# Patient Record
Sex: Female | Born: 1975 | Race: White | Hispanic: No | Marital: Married | Smoking: Never smoker
Health system: Southern US, Community
[De-identification: ages and names within clinical notes are randomized; demographics above are authoritative.]

## PROBLEM LIST (undated history)

## (undated) DIAGNOSIS — E282 Polycystic ovarian syndrome: Secondary | ICD-10-CM

## (undated) DIAGNOSIS — E079 Disorder of thyroid, unspecified: Secondary | ICD-10-CM

## (undated) HISTORY — DX: Polycystic ovarian syndrome: E28.2

## (undated) HISTORY — PX: BACK SURGERY: SHX140

## (undated) HISTORY — PX: BREAST BIOPSY: SHX20

## (undated) HISTORY — PX: WISDOM TOOTH EXTRACTION: SHX21

---

## 2010-07-09 ENCOUNTER — Ambulatory Visit: Payer: Self-pay | Admitting: Family Medicine

## 2010-07-09 DIAGNOSIS — R51 Headache: Secondary | ICD-10-CM | POA: Insufficient documentation

## 2010-07-09 DIAGNOSIS — R519 Headache, unspecified: Secondary | ICD-10-CM | POA: Insufficient documentation

## 2010-07-09 DIAGNOSIS — N3000 Acute cystitis without hematuria: Secondary | ICD-10-CM | POA: Insufficient documentation

## 2010-07-09 DIAGNOSIS — E039 Hypothyroidism, unspecified: Secondary | ICD-10-CM

## 2010-07-09 DIAGNOSIS — E109 Type 1 diabetes mellitus without complications: Secondary | ICD-10-CM | POA: Insufficient documentation

## 2010-07-09 LAB — CONVERTED CEMR LAB
Bilirubin Urine: NEGATIVE
Glucose, Urine, Semiquant: NEGATIVE

## 2010-12-06 NOTE — Assessment & Plan Note (Signed)
Summary: Frequent, painful urination, cough-white, L ear pressurex3-4rm 5   Vital Signs:  Patient Profile:   35 Years Old Female CC:      Frequent, painful urination x 3-4 dys LMP:     06/27/2010 Height:     67 inches Weight:      208 pounds O2 Sat:      98 % O2 treatment:    Room Air Temp:     98.4 degrees F oral Pulse rate:   84 / minute Pulse rhythm:   regular Resp:     18 per minute BP sitting:   116 / 82  (left arm) Cuff size:   regular  Vitals Entered By: Areta Haber CMA (July 09, 2010 9:45 AM)  Menstrual History: LMP (date): 06/27/2010 LMP - Character: normal LMP - Reliable: Yes                  Current Allergies: No known allergies History of Present Illness Chief Complaint: Frequent, painful urination x 3-4 dys History of Present Illness:  Subjective:  Patient presents complaining of UTI symptoms for 2 days.  Complains of dysuria, frequency, nocturia, and urgency.  No hematuria.  No abnormal vaginal discharge.  No fever/chills/sweats.  No abdominal pain.  No flank pain.  No nausea/vomiting.  Last menstrual period normal.   Current Problems: ACUTE CYSTITIS (ICD-595.0) HYPOTHYROIDISM (ICD-244.9) HEADACHE (ICD-784.0) DIABETES MELLITUS, TYPE I (ICD-250.01)   Current Meds ARMOUR THYROID 120 MG TABS (THYROID) 1 tab by mouth once daily GLUMETZA 1000 MG XR24H-TAB (METFORMIN HCL) 1 tab by mouth once daily PHENTERMINE HCL 15 MG CAPS (PHENTERMINE HCL) 1 tab by mouth once daily ALEVE COLD & SINUS 120-220 MG XR12H-TAB (PSEUDOEPHEDRINE-NAPROXEN NA) as directed CLARITIN 10 MG TABS (LORATADINE) as directed MACROBID 100 MG CAPS (NITROFURANTOIN MONOHYD MACRO) 1 by mouth q12hr with food PYRIDIUM 200 MG TABS (PHENAZOPYRIDINE HCL) 1 by mouth three times a day pc  REVIEW OF SYSTEMS Constitutional Symptoms      Denies fever, chills, night sweats, weight loss, weight gain, and fatigue.  Eyes       Denies change in vision, eye pain, eye discharge, glasses, contact  lenses, and eye surgery. Ear/Nose/Throat/Mouth       Complains of ear pain, frequent runny nose, and sinus problems.      Denies hearing loss/aids, change in hearing, ear discharge, dizziness, frequent nose bleeds, sore throat, hoarseness, and tooth pain or bleeding.      Comments: L-pressurex3-4 dys  Respiratory       Complains of productive cough.      Denies dry cough, wheezing, shortness of breath, asthma, bronchitis, and emphysema/COPD.  Cardiovascular       Denies murmurs, chest pain, and tires easily with exhertion.    Gastrointestinal       Denies stomach pain, nausea/vomiting, diarrhea, constipation, blood in bowel movements, and indigestion. Genitourniary       Complains of painful urination.      Denies kidney stones and loss of urinary control.      Comments: Frequent x 3-4 dys Neurological       Complains of headaches.      Denies paralysis, seizures, and fainting/blackouts. Musculoskeletal       Denies muscle pain, joint pain, joint stiffness, decreased range of motion, redness, swelling, muscle weakness, and gout.      Comments: LBP Skin       Denies bruising, unusual mles/lumps or sores, and hair/skin or nail changes.  Psych  Denies mood changes, temper/anger issues, anxiety/stress, speech problems, depression, and sleep problems. Other Comments: Milky white. Pt has not seen her PCP for this.   Past History:  Past Medical History: Diabetes mellitus, type I Headache Hypothyroidism  Past Surgical History: Denies surgical history  Social History: Never Smoked Alcohol use-no Drug use-no Regular exercise-no Smoking Status:  never Drug Use:  no Does Patient Exercise:  no   Objective:   No acute distress, appears comfortable. Mouth:  moist mucous membranes Abdomen:  No tenderness to deep palpation. No rebound or guarding.  Non-distended.  Active bowel sounds.  No CVA or flank tenderness.  No masses or hepatosplenomegaly.    urinalysis (dipstick):  3+  leuks, 3+ blood Assessment New Problems: ACUTE CYSTITIS (ICD-595.0) HYPOTHYROIDISM (ICD-244.9) HEADACHE (ICD-784.0) DIABETES MELLITUS, TYPE I (ICD-250.01)   Plan New Medications/Changes: PYRIDIUM 200 MG TABS (PHENAZOPYRIDINE HCL) 1 by mouth three times a day pc  #6 x 0, 07/09/2010, Donna Christen MD MACROBID 100 MG CAPS (NITROFURANTOIN MONOHYD MACRO) 1 by mouth q12hr with food  #14 x 0, 07/09/2010, Donna Christen MD  New Orders: New Patient Level III [99203] Urinalysis [81003-65000] T-Culture, Urine [[id number]] Planning Comments:   Begin Macrobid and Pyridium.  Culture pending. Increase fluids. Follow-up with PCP if not improving.   The patient and/or caregiver has been counseled thoroughly with regard to medications prescribed including dosage, schedule, interactions, rationale for use, and possible side effects and they verbalize understanding.  Diagnoses and expected course of recovery discussed and will return if not improved as expected or if the condition worsens. Patient and/or caregiver verbalized understanding.  Prescriptions: PYRIDIUM 200 MG TABS (PHENAZOPYRIDINE HCL) 1 by mouth three times a day pc  #6 x 0   Entered and Authorized by:   Donna Christen MD   Signed by:   Donna Christen MD on 07/09/2010   Method used:   Print then Give to Patient   RxID:   [id number] MACROBID 100 MG CAPS (NITROFURANTOIN MONOHYD MACRO) 1 by mouth q12hr with food  #14 x 0   Entered and Authorized by:   Donna Christen MD   Signed by:   Donna Christen MD on 07/09/2010   Method used:   Print then Give to Patient   RxID:   [id number]   Orders Added: 1)  New Patient Level III [41324] 2)  Urinalysis [81003-65000] 3)  T-Culture, Urine [[id number]]  Laboratory Results   Urine Tests  Date/Time Received: July 09, 2010 10:02 AM  Date/Time Reported: July 09, 2010 10:02 AM   Routine Urinalysis   Color: lt. yellow Appearance: Hazy Glucose: negative   (Normal Range:  Negative) Bilirubin: negative   (Normal Range: Negative)    Comments: Husband had vasectomy     Appended Document: Frequent, painful urination, cough-white, L ear pressurex3-4rm 5 Urine Results: GLU Neg BIL Neg KET Neg SG 1.010 BLO Moderate pH 6.0 PRO Trace URO 0.2 NIT Neg LEU large  Appended Document: Frequent, painful urination, cough-white, L ear pressurex3-4rm 5 F/U cll to pt - Courtesy call mess left on VM.

## 2011-01-26 ENCOUNTER — Inpatient Hospital Stay (HOSPITAL_COMMUNITY): Admission: RE | Admit: 2011-01-26 | Payer: BC Managed Care – PPO | Source: Ambulatory Visit

## 2012-05-06 ENCOUNTER — Emergency Department (INDEPENDENT_AMBULATORY_CARE_PROVIDER_SITE_OTHER)
Admission: EM | Admit: 2012-05-06 | Discharge: 2012-05-06 | Disposition: A | Discharge status: Home / Self Care | Payer: BC Managed Care – PPO | Source: Home / Self Care

## 2012-05-06 ENCOUNTER — Encounter: Payer: Self-pay | Admitting: Emergency Medicine

## 2012-05-06 DIAGNOSIS — S60569A Insect bite (nonvenomous) of unspecified hand, initial encounter: Secondary | ICD-10-CM

## 2012-05-06 DIAGNOSIS — W57XXXA Bitten or stung by nonvenomous insect and other nonvenomous arthropods, initial encounter: Secondary | ICD-10-CM

## 2012-05-06 HISTORY — DX: Disorder of thyroid, unspecified: E07.9

## 2012-05-06 NOTE — ED Notes (Signed)
Insect bite on Rt 4th finger x 9 days ago, painful red

## 2012-05-06 NOTE — ED Provider Notes (Signed)
History     CSN: [id number]  Arrival date & time 05/06/12  1749   First MD Initiated Contact with Patient 05/06/12 1759      Chief Complaint  Patient presents with  . Insect Bite    HPI Comments: Pt reports bug bite while in [place] last week.  Pt reports R 4th finger blistering after incident followed by mild swelling that lasted 1-2 days.  Swelling and blistering then resolved.  Pt unsure of what type of insect caused bite.  No fevers or chills since this point.  Has had some intermittent R hand and forearm pain  No joint pain, rashes, headache or generalized malaise. Area has been progressively healing over last week.     The history is provided by the patient.    Past Medical History  Diagnosis Date  . Thyroid disease   . Diabetes mellitus     History reviewed. No pertinent past surgical history.  History reviewed. No pertinent family history.  History  Substance Use Topics  . Smoking status: Never Smoker   . Smokeless tobacco: Not on file  . Alcohol Use: No    OB History    Grav Para Term Preterm Abortions TAB SAB Ect Mult Living                  Review of Systems  All other systems reviewed and are negative.    Allergies  Review of patient's allergies indicates no known allergies.  Home Medications   Current Outpatient Rx  Name Route Sig Dispense Refill  . PHENTERMINE HCL 37.5 MG PO CAPS Oral Take 37.5 mg by mouth every morning.    . THYROID 120 MG PO TABS Oral Take 120 mg by mouth daily.      BP 123/80  Pulse 80  Temp 98.3 F (36.8 C) (Oral)  Resp 16  Ht 5\' 6"  (1.676 m)  Wt 194 lb (87.998 kg)  BMI 31.31 kg/m2  LMP 04/22/2012  Physical Exam  Constitutional: She appears well-developed and well-nourished.  HENT:  Head: Normocephalic and atraumatic.  Eyes: EOM are normal. Pupils are equal, round, and reactive to light.  Neck: Normal range of motion. Neck supple.  Cardiovascular: Normal rate and regular rhythm.   Pulmonary/Chest:  Effort normal and breath sounds normal.  Musculoskeletal:       Hands:   ED Course  Procedures (including critical care time)  Labs Reviewed - No data to display No results found.   No diagnosis found.    MDM  Insect bite.  Healing well. No signs of infection or necrosis.  Will continue to follow.  Discussed general and systemic red flags for reevaluation.  Pt agreeable to plan.     The patient and/or caregiver has been counseled thoroughly with regard to treatment plan and/or medications prescribed including dosage, schedule, interactions, rationale for use, and possible side effects and they verbalize understanding. Diagnoses and expected course of recovery discussed and will return if not improved as expected or if the condition worsens. Patient and/or caregiver verbalized understanding.             Floydene Flock, MD 05/06/12 [phone]

## 2012-05-08 NOTE — ED Provider Notes (Signed)
Pt seen by Dr. Doroteo Bradford, MD 05/08/12 [phone]

## 2012-05-10 ENCOUNTER — Telehealth: Payer: Self-pay

## 2012-05-10 NOTE — ED Notes (Signed)
Left a message on voice mail asking how patient is feeling and advising to call back with any questions or concerns.  

## 2013-01-06 ENCOUNTER — Emergency Department (INDEPENDENT_AMBULATORY_CARE_PROVIDER_SITE_OTHER)
Admission: EM | Admit: 2013-01-06 | Discharge: 2013-01-06 | Disposition: A | Discharge status: Home / Self Care | Payer: BC Managed Care – PPO | Source: Home / Self Care | Attending: Family Medicine | Admitting: Family Medicine

## 2013-01-06 ENCOUNTER — Emergency Department (INDEPENDENT_AMBULATORY_CARE_PROVIDER_SITE_OTHER): Payer: BC Managed Care – PPO

## 2013-01-06 ENCOUNTER — Encounter: Payer: Self-pay | Admitting: *Deleted

## 2013-01-06 DIAGNOSIS — R109 Unspecified abdominal pain: Secondary | ICD-10-CM

## 2013-01-06 DIAGNOSIS — N3000 Acute cystitis without hematuria: Secondary | ICD-10-CM

## 2013-01-06 DIAGNOSIS — D259 Leiomyoma of uterus, unspecified: Secondary | ICD-10-CM

## 2013-01-06 DIAGNOSIS — N831 Corpus luteum cyst of ovary, unspecified side: Secondary | ICD-10-CM

## 2013-01-06 DIAGNOSIS — R10A0 Flank pain, unspecified side: Secondary | ICD-10-CM

## 2013-01-06 LAB — POCT URINALYSIS DIP (MANUAL ENTRY)
Glucose, UA: NEGATIVE
Ketones, POC UA: NEGATIVE
Protein Ur, POC: NEGATIVE
Spec Grav, UA: 1.01 (ref 1.005–1.03)
Urobilinogen, UA: 0.2 (ref 0–1)

## 2013-01-06 LAB — POCT CBC W AUTO DIFF (K'VILLE URGENT CARE)

## 2013-01-06 LAB — SEDIMENTATION RATE: Sed Rate: 4 mm/hr (ref 0–22)

## 2013-01-06 LAB — RETICULOCYTES: RBC.: 4.38 MIL/uL (ref 3.87–5.11)

## 2013-01-06 MED ORDER — IOHEXOL 300 MG/ML  SOLN
100.0000 mL | Freq: Once | INTRAMUSCULAR | Status: AC | PRN
  Administered 2013-01-06: 100 mL via INTRAVENOUS

## 2013-01-06 MED ORDER — CEPHALEXIN 500 MG PO CAPS: 500.0000 mg | ORAL_CAPSULE | Freq: Three times a day (TID) | ORAL | Status: AC

## 2013-01-06 NOTE — ED Provider Notes (Addendum)
History     CSN: [id number]  Arrival date & time 01/06/13  0840   First MD Initiated Contact with Patient 01/06/13 [phone]      Chief Complaint  Patient presents with  . Back Pain  . Urinary Frequency   HPI This is a 37 year old female with prior history of hypothyroidism presenting today with multiple complaints over the past 4-5 days. Patient's issues vacuuming for 5 days ago and noticed right-sided lower back pain. Patient states his symptoms progress to involve bilateral back as well as abdomen over the course 24 hours. Patient states she also developed increased urinary frequency, though no dysuria or hematuria. No vaginal discharge or hx/o STDs. Patient has a prior history of urinary tract infections. Patient states symptoms are not similar to prior UTIs apart from increased urinary frequency. No hx/o kidney stones.  Patient also reports some intermittent distal paresthesias involving the hands and legs. Pain seems to be more of an ache that is 8 out of 10 at its worse. Pain can be worse with movement though has had flares while at rest. Has had some intermittent night sweats that involve lower extremities. Is followed by local endocrinologist about thyroid. Last TSH was 09/2012. This was normal per pt. Pt also reports some easy bruising over this time frame. No gum bleeding. + Family hx/o thyroid disease. Otherwise no other known medical issues. Pt does not have PCP.     Past Medical History  Diagnosis Date  . Thyroid disease   . Diabetes mellitus     Past Surgical History  Procedure Laterality Date  . Wisdom tooth extraction      Family History  Problem Relation Age of Onset  . Cancer Other   . Asthma Other     breast CA    History  Substance Use Topics  . Smoking status: Never Smoker   . Smokeless tobacco: Never Used  . Alcohol Use: Yes     Comment: social    OB History   Grav Para Term Preterm Abortions TAB SAB Ect Mult Living                  Review of Systems   All other systems reviewed and are negative.    Allergies  Review of patient's allergies indicates no known allergies.  Home Medications   Current Outpatient Rx  Name  Route  Sig  Dispense  Refill  . thyroid (ARMOUR) 120 MG tablet   Oral   Take 120 mg by mouth daily.           BP 125/84  Pulse 72  Temp(Src) 98.2 F (36.8 C) (Oral)  Ht 5' 6.5" (1.689 m)  Wt 202 lb (91.627 kg)  BMI 32.12 kg/m2  SpO2 98%  LMP 12/20/2012  Physical Exam  Constitutional: She appears well-developed and well-nourished.  HENT:  Head: Normocephalic and atraumatic.  Right Ear: External ear normal.  Left Ear: External ear normal.  Eyes: Conjunctivae are normal. Pupils are equal, round, and reactive to light.  Neck: Normal range of motion. Neck supple.  Cardiovascular: Normal rate and regular rhythm.   Pulmonary/Chest: Effort normal and breath sounds normal.  Abdominal:  + bowel sounds.  + TTP in RLQ, periumbilical area and LLQ (8/10) + R sided flank pain   Musculoskeletal: Normal range of motion.  Neurological: She is alert.  Skin: Skin is warm. No rash noted. No erythema.  Psychiatric: She has a normal mood and affect.    ED Course  Procedures (including critical care time)  Labs Reviewed  COMPREHENSIVE METABOLIC PANEL  SEDIMENTATION RATE  CK ISOENZYMES  TSH  VITAMIN B12  FOLATE  IRON AND TIBC  FERRITIN  RETICULOCYTES  POCT CBC W AUTO DIFF ([facility])  POCT URINALYSIS DIP (MANUAL ENTRY)   No results found.   1. Acute cystitis   2. Flank pain   3. Abdominal pain       MDM  Overall symptoms are extremely broad. Given abdominal pain we'll check a CT of abdomen and pelvis with IV and oral contrast to rule out intra-abdominal pathology like kidney stone, upper urinary tract infection, ovarian cyst. Will also check baseline labs including sedimentation rate, CK, TSH, anemia panel to eval for diffuse symptoms such as paresthesias. Given the patient does have  positive leukocytes on her urinalysis in the setting of flank pain and polyuria, will place patient on course of Keflex for UTI coverage. Urine culture. Discussed with patient she will need PCP followup for chronic issues. Checking TSH today in setting of Synthroid use. Follow up with primary care in 5-7 days for reevaluation of sxs.     The patient and/or caregiver has been counseled thoroughly with regard to treatment plan and/or medications prescribed including dosage, schedule, interactions, rationale for use, and possible side effects and they verbalize understanding. Diagnoses and expected course of recovery discussed and will return if not improved as expected or if the condition worsens. Patient and/or caregiver verbalized understanding.             Doree Albee, MD 01/06/13 1223  Doree Albee, MD 01/06/13 1227  Clinical update:  Ct Abdomen Pelvis W Contrast  01/06/2013  *RADIOLOGY REPORT*  Clinical Data: Left lower quadrant pain  CT ABDOMEN AND PELVIS WITH CONTRAST  Technique:  Multidetector CT imaging of the abdomen and pelvis was performed following the standard protocol during bolus administration of intravenous contrast.  Contrast: OMNIPAQUE IOHEXOL 300 MG/ML  SOLN  Comparison: None.  Findings: Lung bases are clear.  The liver and gallbladder and bile ducts are normal.  Pancreas spleen and kidneys are normal.  No renal calculi or obstruction.  Negative for bowel obstruction or bowel thickening.  Appendix is normal.  Tiny free fluid in the pelvis.  Corpus luteum cyst right ovary.  25 mm hyperdensity left fundus of the uterus compatible with a fibroid.  Urinary bladder is normal.  IMPRESSION: Corpus luteum cyst on the right with a minimal amount of free fluid.  25 mm left uterine fibroid.   Original Report Authenticated By: Janeece Riggers, M.D.    Noted corpus luteum cyst on L and uterine fibroid on R.  Discussed that if pain worsens/persist despite treatment, follow up  with GYN.  Pt expressed understanding.    Doree Albee, MD 01/06/13 1302

## 2013-01-06 NOTE — ED Notes (Signed)
[name] c/o 4 days of low back pain, pelvic pressure and urinary frequency x 4 days. 2 days of bruising without injury, sweats from the waist down and nausea.

## 2013-01-07 LAB — IRON AND TIBC
%SAT: 22 % (ref 20–55)
TIBC: 437 ug/dL (ref 250–470)
UIBC: 339 ug/dL (ref 125–400)

## 2013-01-07 LAB — COMPREHENSIVE METABOLIC PANEL
ALT: 17 U/L (ref 0–35)
AST: 16 U/L (ref 0–37)
Albumin: 4.5 g/dL (ref 3.5–5.2)
BUN: 12 mg/dL (ref 6–23)
CO2: 26 mEq/L (ref 19–32)
Calcium: 9.7 mg/dL (ref 8.4–10.5)
Chloride: 104 mEq/L (ref 96–112)
Creat: 0.61 mg/dL (ref 0.50–1.10)
Potassium: 4.2 mEq/L (ref 3.5–5.3)

## 2013-01-07 LAB — FOLATE: Folate: 7.4 ng/mL

## 2013-01-07 LAB — VITAMIN B12: Vitamin B-12: 303 pg/mL (ref 211–911)

## 2013-01-08 LAB — CK ISOENZYMES

## 2013-01-11 LAB — URINE CULTURE

## 2013-01-12 ENCOUNTER — Telehealth: Payer: Self-pay | Admitting: *Deleted

## 2013-01-13 ENCOUNTER — Telehealth: Payer: Self-pay | Admitting: Emergency Medicine

## 2014-01-07 ENCOUNTER — Emergency Department (INDEPENDENT_AMBULATORY_CARE_PROVIDER_SITE_OTHER): Payer: BC Managed Care – PPO

## 2014-01-07 ENCOUNTER — Emergency Department (INDEPENDENT_AMBULATORY_CARE_PROVIDER_SITE_OTHER)
Admission: EM | Admit: 2014-01-07 | Discharge: 2014-01-07 | Disposition: A | Discharge status: Home / Self Care | Payer: BC Managed Care – PPO | Source: Home / Self Care | Attending: Family Medicine | Admitting: Family Medicine

## 2014-01-07 ENCOUNTER — Encounter: Payer: Self-pay | Admitting: Emergency Medicine

## 2014-01-07 DIAGNOSIS — R059 Cough, unspecified: Secondary | ICD-10-CM

## 2014-01-07 DIAGNOSIS — R053 Chronic cough: Secondary | ICD-10-CM

## 2014-01-07 DIAGNOSIS — R05 Cough: Secondary | ICD-10-CM

## 2014-01-07 LAB — POCT CBC W AUTO DIFF (K'VILLE URGENT CARE)

## 2014-01-07 MED ORDER — BENZONATATE 200 MG PO CAPS: 200.0000 mg | ORAL_CAPSULE | Freq: Every day | ORAL | Status: DC

## 2014-01-07 MED ORDER — PREDNISONE 20 MG PO TABS: 20.0000 mg | ORAL_TABLET | Freq: Two times a day (BID) | ORAL | Status: DC

## 2014-01-07 MED ORDER — CLARITHROMYCIN 250 MG PO TABS: ORAL_TABLET | ORAL | Status: DC

## 2014-01-07 NOTE — ED Notes (Signed)
Beverly [name] c/o non-productive cough, congestion since mid January. The severity of the symptoms have been intermittent. Taken gen allergy med otc.

## 2014-01-07 NOTE — Discharge Instructions (Signed)
Take plain Mucinex (1200 mg guaifenesin) twice daily for cough and congestion.  May add Sudafed for sinus congestion.   Increase fluid intake, rest. °May use Afrin nasal spray (or generic oxymetazoline) twice daily for about 5 days.  Also recommend using saline nasal spray several times daily and saline nasal irrigation (AYR is a common brand) °Stop all antihistamines for now, and other non-prescription cough/cold preparations. °Follow-up with family doctor if not improving 7 to 10 days.  °

## 2014-01-07 NOTE — ED Provider Notes (Signed)
CSN: [id number]     Arrival date & time 01/07/14  0806 History   None    Chief Complaint  Patient presents with  . URI      HPI Comments: Patient reports that she developed a cold about 6 weeks ago and seemed to recover except for a persistent cough.  Over the last week she has developed chills/sweats, and wheezing with increased activity.  She notes that she has a history of allergic rhinitis, especially around dogs.  The history is provided by the patient.    Past Medical History  Diagnosis Date  . Thyroid disease   . Diabetes mellitus    Past Surgical History  Procedure Laterality Date  . Wisdom tooth extraction     Family History  Problem Relation Age of Onset  . Cancer Other   . Asthma Other     breast CA   History  Substance Use Topics  . Smoking status: Never Smoker   . Smokeless tobacco: Never Used  . Alcohol Use: Yes     Comment: social   OB History   Grav Para Term Preterm Abortions TAB SAB Ect Mult Living                 Review of Systems No sore throat + cough No pleuritic pain No wheezing + nasal congestion + post-nasal drainage No sinus pain/pressure No itchy/red eyes ? earache No hemoptysis + SOB No fever, + chills/sweats No nausea No vomiting No abdominal pain No diarrhea No urinary symptoms No skin rash No fatigue No myalgias No headache Used OTC meds without relief  Allergies  Review of patient's allergies indicates no known allergies.  Home Medications   Current Outpatient Rx  Name  Route  Sig  Dispense  Refill  . Multiple Vitamin (MULTIVITAMIN) tablet   Oral   Take 1 tablet by mouth daily.         . benzonatate (TESSALON) 200 MG capsule   Oral   Take 1 capsule (200 mg total) by mouth at bedtime. Take as needed for cough   12 capsule   0   . clarithromycin (BIAXIN) 250 MG tablet      Take one tab by mouth twice daily.  Take with food   20 tablet   0   . predniSONE (DELTASONE) 20 MG tablet   Oral   Take 1 tablet  (20 mg total) by mouth 2 (two) times daily. Take with food.   10 tablet   0   . thyroid (ARMOUR) 120 MG tablet   Oral   Take 120 mg by mouth daily.          BP 130/86  Pulse 76  Temp(Src) 97.9 F (36.6 C) (Oral)  Resp 14  Wt 215 lb (97.523 kg)  SpO2 97%  LMP 12/30/2013 Physical Exam Nursing notes and Vital Signs reviewed. Appearance:  Patient appears stated age, and in no acute distress.  Patient is obese Eyes:  Pupils are equal, round, and reactive to light and accomodation.  Extraocular movement is intact.  Conjunctivae are not inflamed  Ears:  Canals normal.  Tympanic membranes normal.  Nose:  Mildly congested turbinates.  No sinus tenderness.   Pharynx:  Normal Neck:  Supple.  Tender shotty posterior nodes are palpated bilaterally  Lungs:  Clear to auscultation.  Breath sounds are equal.  Heart:  Regular rate and rhythm without murmurs, rubs, or gallops.  Abdomen:  Nontender without masses or hepatosplenomegaly.  Bowel sounds are  present.  No CVA or flank tenderness.  Extremities:  No edema.  No calf tenderness Skin:  No rash present.   ED Course  Procedures  none    Labs Reviewed  POCT CBC W AUTO DIFF ([facility]):  WBC 6.4; LY 30.2; MO 6.2; GR 63.6; Hgb 13.5; Platelets 231    Imaging Review Dg Chest 2 View  01/07/2014   CLINICAL DATA:  Persistent cough for 2-1/2 months  EXAM: CHEST  2 VIEW  COMPARISON:  None.  FINDINGS: No active infiltrate or effusion is seen. Mediastinal contours appear normal. The heart is within normal limits in size. No acute bony abnormality is seen. Some deformity of the left clavicle most likely is due to prior fracture with healing.  IMPRESSION: No active lung disease.   Electronically Signed   By: Ivar Drape M.D.   On: 01/07/2014 08:54     MDM   1. Chronic cough    Begin Biaxin to cover atypicals.  Prednisone burst.  Prescription written for Benzonatate (Tessalon) to take at bedtime for night-time cough.  Take plain Mucinex  (1200 mg guaifenesin) twice daily for cough and congestion.  May add Sudafed for sinus congestion.   Increase fluid intake, rest. May use Afrin nasal spray (or generic oxymetazoline) twice daily for about 5 days.  Also recommend using saline nasal spray several times daily and saline nasal irrigation (AYR is a common brand) Stop all antihistamines for now, and other non-prescription cough/cold preparations. Follow-up with family doctor if not improving 7 to 10 days.     Kandra Nicolas, MD 01/08/14 [phone]

## 2014-06-01 ENCOUNTER — Encounter: Payer: Self-pay | Admitting: Emergency Medicine

## 2014-06-01 ENCOUNTER — Emergency Department (INDEPENDENT_AMBULATORY_CARE_PROVIDER_SITE_OTHER)
Admission: EM | Admit: 2014-06-01 | Discharge: 2014-06-01 | Disposition: A | Discharge status: Home / Self Care | Payer: BC Managed Care – PPO | Source: Home / Self Care | Attending: Emergency Medicine | Admitting: Emergency Medicine

## 2014-06-01 DIAGNOSIS — R300 Dysuria: Secondary | ICD-10-CM

## 2014-06-01 DIAGNOSIS — S2341XA Sprain of ribs, initial encounter: Secondary | ICD-10-CM

## 2014-06-01 DIAGNOSIS — R309 Painful micturition, unspecified: Secondary | ICD-10-CM

## 2014-06-01 DIAGNOSIS — S29011A Strain of muscle and tendon of front wall of thorax, initial encounter: Secondary | ICD-10-CM

## 2014-06-01 LAB — POCT URINALYSIS DIP (MANUAL ENTRY)
Bilirubin, UA: NEGATIVE
Blood, UA: NEGATIVE
Glucose, UA: NEGATIVE
Ketones, POC UA: NEGATIVE
Leukocytes, UA: NEGATIVE
Nitrite, UA: NEGATIVE
Protein Ur, POC: NEGATIVE
Spec Grav, UA: 1.025 (ref 1.005–1.03)
Urobilinogen, UA: 0.2 (ref 0–1)
pH, UA: 7 (ref 5–8)

## 2014-06-01 MED ORDER — TRAMADOL-ACETAMINOPHEN 37.5-325 MG PO TABS: ORAL_TABLET | ORAL | Status: DC

## 2014-06-01 NOTE — ED Provider Notes (Signed)
CSN: [id number]     Arrival date & time 06/01/14  1246 History   First MD Initiated Contact with Patient 06/01/14 1431     Chief Complaint  Patient presents with  . Urinary Frequency  . Back Pain  . Pelvic Pain    Patient is a 38 y.o. female presenting with back pain. The history is provided by the patient.  Back Pain Pain location: Right thoracic, posterior lateral. Quality: sharp and aching. Radiates to: Towards the right anterior ribs/costal margin. Pain severity:  Moderate Pain is:  Unable to specify Onset quality:  Unable to specify Timing:  Intermittent Progression:  Waxing and waning Chronicity:  New Relieved by: Rest. Exacerbated by: Twisting or pressing on the area. Ineffective treatments: Motrin. Associated symptoms: no abdominal pain, no chest pain, no dysuria, no fever, no leg pain, no numbness and no pelvic pain    About 3 weeks ago, she was helping her husband built a gondola, and she was stretched out holding a heavy object in various positions. Since then, right intercostal muscle pain, exacerbated by movement. She denies any history of direct trauma. No anterior chest pain or any breathing problems. No abdominal pain, nausea, vomiting. She mentions that she may have had a twinge of pelvic pain but that resolved . Last menstrual period started 05/21/2014 and ended after 5 days. Has minimal clear vaginal discharge for one day, which she states is normal for her at this time in her menstrual cycle . Denies vaginal symptoms. No vaginal bleeding currently Last week she felt some vague dysuria and occasional urinary frequency but that's resolved. She saw her PCP 3 days ago, evaluated with normal urinalysis and was told today that her urine culture there was negative . Denies any hematuria or pyuria. She felt she might have a fever 2 days ago but that's resolved. May have had some nausea but that's resolved. No ENT symptoms   She states that in the past had a CT of  abdomen for some vague abdominal pain, and I reviewed in her chart that on 01/06/2013, CT abdomen and pelvis with contrast was negative except for a 25 mm corpus luteum cyst on the right. She has not seeing the gynecologist for any routine care in several years, but she's just made an appointment with the GYN for 8/19.   Past Medical History  Diagnosis Date  . Thyroid disease   . Diabetes mellitus    Past Surgical History  Procedure Laterality Date  . Wisdom tooth extraction     Family History  Problem Relation Age of Onset  . Cancer Other   . Asthma Other     breast CA   History  Substance Use Topics  . Smoking status: Never Smoker   . Smokeless tobacco: Never Used  . Alcohol Use: Yes     Comment: social   OB History   Grav Para Term Preterm Abortions TAB SAB Ect Mult Living                 Review of Systems  Constitutional: Negative for fever.  Cardiovascular: Negative for chest pain.  Gastrointestinal: Negative for abdominal pain.  Genitourinary: Negative for dysuria and pelvic pain.  Musculoskeletal: Positive for back pain.  Neurological: Negative for numbness.  All other systems reviewed and are negative.   Allergies  Review of patient's allergies indicates no known allergies.  Home Medications   Prior to Admission medications   Medication Sig Start Date End Date Taking? Authorizing Provider  Multiple Vitamin (MULTIVITAMIN) tablet Take 1 tablet by mouth daily.   Yes Historical Provider, MD  thyroid (ARMOUR) 120 MG tablet Take 120 mg by mouth daily.   Yes Historical Provider, MD  traMADol-acetaminophen (ULTRACET) 37.5-325 MG per tablet 1 or 2 every 4-6 hours as needed for moderate-severe pain.  Caution: May cause drowsiness 06/01/14   Jacqulyn Cane, MD   BP 139/86  Pulse 88  Temp(Src) 98.3 F (36.8 C) (Oral)  Ht 5\' 6"  (1.676 m)  SpO2 98%  LMP 05/11/2014 Physical Exam  Nursing note and vitals reviewed. Constitutional: She is oriented to person, place, and  time. She appears well-developed and well-nourished. No distress.  No distress.  HENT:  Head: Normocephalic and atraumatic.  Mouth/Throat: Oropharynx is clear and moist.  Eyes: Conjunctivae and EOM are normal. Pupils are equal, round, and reactive to light. Right eye exhibits no discharge. Left eye exhibits no discharge. No scleral icterus.  Neck: Normal range of motion. Neck supple. No JVD present.  Cardiovascular: Normal rate, regular rhythm and normal heart sounds.  Exam reveals no gallop and no friction rub.   No murmur heard. Pulmonary/Chest: Effort normal and breath sounds normal. No respiratory distress. She has no wheezes. She has no rales.  Abdominal: Soft. Bowel sounds are normal. She exhibits no distension and no mass. There is no hepatosplenomegaly. There is no tenderness. There is no rigidity, no rebound, no guarding, no tenderness at McBurney's point and negative Murphy's sign.  Musculoskeletal: Normal range of motion.       Arms: No deformity. Moderate tenderness to palpation right parathoracic muscles and lateral intercostal muscles. Exacerbated by twisting or going from lying to sitting. She states that that exactly reproduces the pain that she's been having. No specific rib tenderness . No deformity. No spinal tenderness or deformity.  Lymphadenopathy:    She has no cervical adenopathy.  Neurological: She is alert and oriented to person, place, and time. She has normal strength. No cranial nerve deficit or sensory deficit. Gait normal.  Skin: Skin is warm. No rash noted.  Psychiatric: She has a normal mood and affect.    ED Course  Procedures (including critical care time) Labs Review Labs Reviewed  POCT URINALYSIS DIP (MANUAL ENTRY) - Normal   Results for orders placed during the hospital encounter of 06/01/14  POCT URINALYSIS DIP (MANUAL ENTRY)      Result Value Ref Range   Color, UA yellow     Clarity, UA clear     Glucose, UA neg     Bilirubin, UA negative      Bilirubin, UA negative     Spec Grav, UA 1.025  1.005 - 1.03   Blood, UA negative     pH, UA 7.0  5 - 8   Protein Ur, POC negative     Urobilinogen, UA 0.2  0 - 1   Nitrite, UA Negative     Leukocytes, UA Negative       Imaging Review No results found.   MDM  Urinalysis today is Normal. Dx: R parathoracic Muscle and R Intercostal mm strain. No evidence of abdominal cause. No evidence of any urinary cause. I doubt any pelvic cause for the symptoms.  Treatment options discussed, as well as risks, benefits, alternatives. Patient voiced understanding and agreement with the following plans: She declined any imaging. Ultracet prn pain Heat. Rest. No lifting x 1 wk. Follow-up with your primary care doctor in 5-7 days if not improving, or sooner if symptoms become  worse. Precautions discussed. Red flags discussed. Questions invited and answered. Patient voiced understanding and agreement.     Jacqulyn Cane, MD 06/01/14 [phone]

## 2014-06-01 NOTE — ED Notes (Signed)
Beverly [name] complains of back pain for 3-4 weeks. She also reports fevers, body aches, dizziness, pelvic pain, nausea, urinary frequency and painful urination for 2 days.

## 2014-06-24 ENCOUNTER — Encounter: Payer: Self-pay | Admitting: Obstetrics & Gynecology

## 2014-06-24 ENCOUNTER — Ambulatory Visit (INDEPENDENT_AMBULATORY_CARE_PROVIDER_SITE_OTHER): Payer: BC Managed Care – PPO | Admitting: Obstetrics & Gynecology

## 2014-06-24 VITALS — BP 119/78 | HR 72 | Resp 16 | Ht 66.0 in | Wt 213.0 lb

## 2014-06-24 DIAGNOSIS — N898 Other specified noninflammatory disorders of vagina: Secondary | ICD-10-CM

## 2014-06-24 DIAGNOSIS — Z0000 Encounter for general adult medical examination without abnormal findings: Secondary | ICD-10-CM

## 2014-06-24 DIAGNOSIS — E669 Obesity, unspecified: Secondary | ICD-10-CM | POA: Insufficient documentation

## 2014-06-24 DIAGNOSIS — Z1151 Encounter for screening for human papillomavirus (HPV): Secondary | ICD-10-CM

## 2014-06-24 DIAGNOSIS — Z124 Encounter for screening for malignant neoplasm of cervix: Secondary | ICD-10-CM

## 2014-06-24 DIAGNOSIS — Z01419 Encounter for gynecological examination (general) (routine) without abnormal findings: Secondary | ICD-10-CM

## 2014-06-24 MED ORDER — METRONIDAZOLE 500 MG PO TABS: 500.0000 mg | ORAL_TABLET | Freq: Two times a day (BID) | ORAL | Status: DC

## 2014-06-24 NOTE — Progress Notes (Signed)
Subjective:    Beverly [name] is a 38 y.o. MW G6P4A2 (12, 41, 68, and 40 yo kids)  female who presents for an annual exam. The patient has no complaints today. The patient is sexually active. GYN screening history: last pap: was normal. The patient wears seatbelts: yes. The patient participates in regular exercise: yes. Has the patient ever been transfused or tattooed?: yes. The patient reports that there is not domestic violence in her life.   Menstrual History: OB History   Grav Para Term Preterm Abortions TAB SAB Ect Mult Living   6 4 4  2 2    4       Menarche age: 86  Patient's last menstrual period was 06/11/2014.    The following portions of the patient's history were reviewed and updated as appropriate: allergies, current medications, past family history, past medical history, past social history, past surgical history and problem list.  Review of Systems A comprehensive review of systems was negative. Married for 15 years, some vaginal dryness. Homemaker   Objective:    BP 119/78  Pulse 72  Resp 16  Ht 5\' 6"  (1.676 m)  Wt 213 lb (96.616 kg)  BMI 34.40 kg/m2  LMP 06/11/2014  General Appearance:    Alert, cooperative, no distress, appears stated age  Head:    Normocephalic, without obvious abnormality, atraumatic  Eyes:    PERRL, conjunctiva/corneas clear, EOM's intact, fundi    benign, both eyes  Ears:    Normal TM's and external ear canals, both ears  Nose:   Nares normal, septum midline, mucosa normal, no drainage    or sinus tenderness  Throat:   Lips, mucosa, and tongue normal; teeth and gums normal  Neck:   Supple, symmetrical, trachea midline, no adenopathy;    thyroid:  no enlargement/tenderness/nodules; no carotid   bruit or JVD  Back:     Symmetric, no curvature, ROM normal, no CVA tenderness  Lungs:     Clear to auscultation bilaterally, respirations unlabored  Chest Wall:    No tenderness or deformity   Heart:    Regular rate and rhythm, S1 and S2 normal,  no murmur, rub   or gallop  Breast Exam:    No tenderness, masses, or nipple abnormality  Abdomen:     Soft, non-tender, bowel sounds active all four quadrants,    no masses, no organomegaly  Genitalia:    Normal female without lesion, discharge or tenderness, frothy vaginal discharge, NSSA, lumpy c/w fibroids, mobile, NT, normal adnexal exam     Extremities:   Extremities normal, atraumatic, no cyanosis or edema  Pulses:   2+ and symmetric all extremities  Skin:   Skin color, texture, turgor normal, no rashes or lesions  Lymph nodes:   Cervical, supraclavicular, and axillary nodes normal  Neurologic:   CNII-XII intact, normal strength, sensation and reflexes    throughout  .    Assessment:    Healthy female exam.  Frothy vaginal discharge   Plan:     Breast self exam technique reviewed and patient encouraged to perform self-exam monthly. Mammogram. Thin prep Pap smear.  with cotesting Wet prep sent

## 2014-06-25 ENCOUNTER — Telehealth: Payer: Self-pay | Admitting: *Deleted

## 2014-06-25 DIAGNOSIS — B373 Candidiasis of vulva and vagina: Secondary | ICD-10-CM

## 2014-06-25 DIAGNOSIS — B3731 Acute candidiasis of vulva and vagina: Secondary | ICD-10-CM

## 2014-06-25 LAB — WET PREP, GENITAL
Clue Cells Wet Prep HPF POC: NONE SEEN
Trich, Wet Prep: NONE SEEN
WBC, Wet Prep HPF POC: NONE SEEN

## 2014-06-25 LAB — CYTOLOGY - PAP

## 2014-06-25 MED ORDER — FLUCONAZOLE 150 MG PO TABS
ORAL_TABLET | ORAL | Status: DC
Start: 2014-06-25 — End: 2015-10-28

## 2014-06-25 NOTE — Telephone Encounter (Signed)
Pt notified of positive yeast and RX called to [facility].

## 2014-06-30 ENCOUNTER — Ambulatory Visit: Payer: BC Managed Care – PPO

## 2014-07-07 ENCOUNTER — Ambulatory Visit (INDEPENDENT_AMBULATORY_CARE_PROVIDER_SITE_OTHER): Payer: BC Managed Care – PPO

## 2014-07-07 DIAGNOSIS — R928 Other abnormal and inconclusive findings on diagnostic imaging of breast: Secondary | ICD-10-CM

## 2014-07-07 DIAGNOSIS — Z0000 Encounter for general adult medical examination without abnormal findings: Secondary | ICD-10-CM

## 2014-07-09 ENCOUNTER — Other Ambulatory Visit: Payer: Self-pay | Admitting: Obstetrics & Gynecology

## 2014-07-09 DIAGNOSIS — R928 Other abnormal and inconclusive findings on diagnostic imaging of breast: Secondary | ICD-10-CM

## 2014-07-20 ENCOUNTER — Other Ambulatory Visit: Payer: Self-pay | Admitting: Obstetrics & Gynecology

## 2014-07-20 ENCOUNTER — Ambulatory Visit
Admission: RE | Admit: 2014-07-20 | Discharge: 2014-07-20 | Disposition: A | Discharge status: Home / Self Care | Payer: BC Managed Care – PPO | Source: Ambulatory Visit | Attending: Obstetrics & Gynecology | Admitting: Obstetrics & Gynecology

## 2014-07-20 DIAGNOSIS — R928 Other abnormal and inconclusive findings on diagnostic imaging of breast: Secondary | ICD-10-CM

## 2014-07-23 ENCOUNTER — Other Ambulatory Visit: Payer: Self-pay

## 2014-07-23 ENCOUNTER — Other Ambulatory Visit: Payer: Self-pay | Admitting: Obstetrics & Gynecology

## 2014-07-23 DIAGNOSIS — R928 Other abnormal and inconclusive findings on diagnostic imaging of breast: Secondary | ICD-10-CM

## 2014-07-27 ENCOUNTER — Other Ambulatory Visit: Payer: Self-pay | Admitting: Obstetrics & Gynecology

## 2014-07-27 ENCOUNTER — Ambulatory Visit
Admission: RE | Admit: 2014-07-27 | Discharge: 2014-07-27 | Disposition: A | Discharge status: Home / Self Care | Payer: BC Managed Care – PPO | Source: Ambulatory Visit | Attending: Obstetrics & Gynecology | Admitting: Obstetrics & Gynecology

## 2014-07-27 DIAGNOSIS — R928 Other abnormal and inconclusive findings on diagnostic imaging of breast: Secondary | ICD-10-CM

## 2014-09-07 ENCOUNTER — Encounter: Payer: Self-pay | Admitting: Obstetrics & Gynecology

## 2015-10-28 ENCOUNTER — Emergency Department (INDEPENDENT_AMBULATORY_CARE_PROVIDER_SITE_OTHER)
Admission: EM | Admit: 2015-10-28 | Discharge: 2015-10-28 | Disposition: A | Discharge status: Home / Self Care | Payer: BLUE CROSS/BLUE SHIELD | Source: Home / Self Care | Attending: Family Medicine | Admitting: Family Medicine

## 2015-10-28 ENCOUNTER — Encounter: Payer: Self-pay | Admitting: *Deleted

## 2015-10-28 DIAGNOSIS — R42 Dizziness and giddiness: Secondary | ICD-10-CM | POA: Diagnosis not present

## 2015-10-28 DIAGNOSIS — R51 Headache: Secondary | ICD-10-CM | POA: Diagnosis not present

## 2015-10-28 DIAGNOSIS — R0981 Nasal congestion: Secondary | ICD-10-CM | POA: Diagnosis not present

## 2015-10-28 DIAGNOSIS — R519 Headache, unspecified: Secondary | ICD-10-CM

## 2015-10-28 MED ORDER — FLUTICASONE PROPIONATE 50 MCG/ACT NA SUSP: 2.0000 | Freq: Every day | NASAL | Status: DC

## 2015-10-28 MED ORDER — DM-GUAIFENESIN ER 30-600 MG PO TB12: 1.0000 | ORAL_TABLET | Freq: Two times a day (BID) | ORAL | Status: DC

## 2015-10-28 MED ORDER — MECLIZINE HCL 25 MG PO TABS: 25.0000 mg | ORAL_TABLET | Freq: Three times a day (TID) | ORAL | Status: DC | PRN

## 2015-10-28 NOTE — ED Provider Notes (Signed)
CSN: [id number]     Arrival date & time 10/28/15  [id number] History   First MD Initiated Contact with Patient 10/28/15 [phone]     Chief Complaint  Patient presents with  . Ear Drainage  . Headache  . Eye Drainage   (Consider location/radiation/quality/duration/timing/severity/associated sxs/prior Treatment) HPI  Pt is a 39yo female presenting to [facility] with c/o frontal headache with nasal congestion and "clogged ears."  Pt reports 1 week long hx of vertigo described as room spinning with mild nausea, hx of similar symptoms in the past. Headache is 5/10 at worst.  She has been going to a chiropractor but only getting minimal relief.  Pt denies any ear pain and states symptoms of congestion only started 2 days ago. Denies fever or chills. Denies cough. Denies sore throat or ear pain.  She has been trying motrin with temporary relief of headaches.   Past Medical History  Diagnosis Date  . Thyroid disease   . Diabetes mellitus   . PCOS (polycystic ovarian syndrome)    Past Surgical History  Procedure Laterality Date  . Wisdom tooth extraction     Family History  Problem Relation Age of Onset  . Cancer Other   . Asthma Other     breast CA   Social History  Substance Use Topics  . Smoking status: Never Smoker   . Smokeless tobacco: Never Used  . Alcohol Use: Yes     Comment: social   OB History    Gravida Para Term Preterm AB TAB SAB Ectopic Multiple Living   6 4 4  2 2    4      Review of Systems  Constitutional: Negative for fever and chills.  HENT: Positive for congestion, rhinorrhea and sinus pressure. Negative for ear pain, sore throat, trouble swallowing and voice change.   Respiratory: Negative for cough and shortness of breath.   Cardiovascular: Negative for chest pain and palpitations.  Gastrointestinal: Negative for nausea, vomiting, abdominal pain and diarrhea.  Musculoskeletal: Negative for myalgias, back pain and arthralgias.  Neurological: Positive for dizziness and  headaches. Negative for light-headedness.    Allergies  Review of patient's allergies indicates no known allergies.  Home Medications   Prior to Admission medications   Medication Sig Start Date End Date Taking? Authorizing Provider  NON FORMULARY Tumeric   Yes Historical Provider, MD  dextromethorphan-guaiFENesin (MUCINEX DM) 30-600 MG 12hr tablet Take 1 tablet by mouth 2 (two) times daily. 10/28/15   Noland Fordyce, PA-C  fluticasone (FLONASE) 50 MCG/ACT nasal spray Place 2 sprays into both nostrils daily. 10/28/15   Noland Fordyce, PA-C  meclizine (ANTIVERT) 25 MG tablet Take 1 tablet (25 mg total) by mouth 3 (three) times daily as needed for dizziness. 10/28/15   Noland Fordyce, PA-C   Meds Ordered and Administered this Visit  Medications - No data to display  BP 130/83 mmHg  Pulse 70  Temp(Src) 98.4 F (36.9 C) (Oral)  Resp 16  Wt 216 lb (97.977 kg)  SpO2 97%  LMP 09/16/2015 No data found.   Physical Exam  Constitutional: She appears well-developed and well-nourished. No distress.  HENT:  Head: Normocephalic and atraumatic.  Right Ear: Hearing, tympanic membrane, external ear and ear canal normal.  Left Ear: Hearing, external ear and ear canal normal.  Nose: Mucosal edema present. Right sinus exhibits no maxillary sinus tenderness and no frontal sinus tenderness. Left sinus exhibits no maxillary sinus tenderness and no frontal sinus tenderness.  Mouth/Throat: Uvula is midline, oropharynx is  clear and moist and mucous membranes are normal.  Eyes: Conjunctivae and EOM are normal. Pupils are equal, round, and reactive to light. No scleral icterus. Right eye exhibits no nystagmus. Left eye exhibits no nystagmus.  Neck: Normal range of motion. Neck supple.  Cardiovascular: Normal rate, regular rhythm and normal heart sounds.   Pulmonary/Chest: Effort normal and breath sounds normal. No respiratory distress. She has no wheezes. She has no rales. She exhibits no tenderness.   Abdominal: Soft. She exhibits no distension and no mass. There is no tenderness. There is no rebound and no guarding.  Musculoskeletal: Normal range of motion.  Neurological: She is alert.  Skin: Skin is warm and dry. She is not diaphoretic.  Nursing note and vitals reviewed.   ED Course  Procedures (including critical care time)  Labs Review Labs Reviewed - No data to display  Imaging Review No results found.  Tympanometry: normal   MDM   1. Nasal congestion   2. Frontal headache   3. Vertigo     Pt c/o 1 week long hx of vertigo but 2 day hx of nasal congestion and frontal headache. Normal neuro exam. No nystagmus.   Tympanometry: normal   No evidence of AOM.  With just 2 days of nasal congestion, will tx conservatively and hold off on antibiotics at this time.  Rx: mucinex DM, Flonase, antivert. Advised she may also use OTC sudafed and continue with ibuprofen  Encouraged fluids and rest. If not improving in 3-4 days, or symptoms continue to worsen, encouraged pt to call [facility] and antibiotic- Augmentin 875/125 BID for 7 days with 3 days of prednisone 40mg  could be called in.   Patient verbalized understanding and agreement with treatment plan.    Noland Fordyce, PA-C 10/28/15 [phone]

## 2015-10-28 NOTE — ED Notes (Signed)
Pt c/o 2 days of HA, eye swelling and drainage, ear drainage and sinus congestion and pain. Taken Motrin otc.

## 2015-10-28 NOTE — Discharge Instructions (Signed)
Please call [facility] if you are not getting better in 3-4 days, sooner if worsening including high fever >100.4, vomiting, severe headache or other new concerning symptoms develop, and an antibiotic will be called in for you.  You may take 400-600mg  Ibuprofen (Motrin) every 6-8 hours for fever and pain  Alternate with Tylenol  You may take 500mg  Tylenol every 4-6 hours as needed for fever and pain  Follow-up with your primary care provider next week for recheck of symptoms if not improving.  Be sure to drink plenty of fluids and rest, at least 8hrs of sleep a night, preferably more while you are sick. Return urgent care or go to closest ER if you cannot keep down fluids/signs of dehydration, fever not reducing with Tylenol, difficulty breathing/wheezing, stiff neck, worsening condition, or other concerns (see below)

## 2015-11-02 ENCOUNTER — Telehealth: Payer: Self-pay | Admitting: Emergency Medicine

## 2015-11-02 NOTE — ED Notes (Signed)
Inquired about patient's status; encourage them to call with questions/concerns.  

## 2016-01-19 ENCOUNTER — Other Ambulatory Visit (HOSPITAL_COMMUNITY): Payer: Self-pay | Admitting: Obstetrics & Gynecology

## 2016-01-19 DIAGNOSIS — Z1231 Encounter for screening mammogram for malignant neoplasm of breast: Secondary | ICD-10-CM

## 2016-01-26 ENCOUNTER — Ambulatory Visit (INDEPENDENT_AMBULATORY_CARE_PROVIDER_SITE_OTHER): Payer: BLUE CROSS/BLUE SHIELD

## 2016-01-26 DIAGNOSIS — Z1231 Encounter for screening mammogram for malignant neoplasm of breast: Secondary | ICD-10-CM | POA: Diagnosis not present

## 2016-02-01 ENCOUNTER — Encounter: Payer: Self-pay | Admitting: Obstetrics & Gynecology

## 2016-02-01 ENCOUNTER — Ambulatory Visit (INDEPENDENT_AMBULATORY_CARE_PROVIDER_SITE_OTHER): Payer: BLUE CROSS/BLUE SHIELD | Admitting: Obstetrics & Gynecology

## 2016-02-01 VITALS — BP 125/80 | HR 78 | Resp 16 | Ht 66.0 in | Wt 222.0 lb

## 2016-02-01 DIAGNOSIS — Z1151 Encounter for screening for human papillomavirus (HPV): Secondary | ICD-10-CM | POA: Diagnosis not present

## 2016-02-01 DIAGNOSIS — Z124 Encounter for screening for malignant neoplasm of cervix: Secondary | ICD-10-CM

## 2016-02-01 DIAGNOSIS — Z01419 Encounter for gynecological examination (general) (routine) without abnormal findings: Secondary | ICD-10-CM | POA: Diagnosis not present

## 2016-02-01 NOTE — Progress Notes (Signed)
Subjective:    Beverly [name] is a 40 y.o.  MW P4 (83, 64, 61, and 50 yo kids)  female who presents for an annual exam. The patient has no complaints today. The patient is sexually active. GYN screening history: last pap: was normal. The patient wears seatbelts: yes. The patient participates in regular exercise: yes. Has the patient ever been transfused or tattooed?: yes. The patient reports that there is not domestic violence in her life.   Menstrual History: OB History    Gravida Para Term Preterm AB TAB SAB Ectopic Multiple Living   6 4 4  2 2    4       Menarche age: 77  Patient's last menstrual period was 01/05/2016.    The following portions of the patient's history were reviewed and updated as appropriate: allergies, current medications, past family history, past medical history, past social history, past surgical history and problem list.  Review of Systems Pertinent items noted in HPI and remainder of comprehensive ROS otherwise negative.  Married 16 years, uses lubrication with sex. Her husband had a vasectomy. Homemaker. Declines flu vaccine. Mammogram normal last week.   Objective:    BP 125/80 mmHg  Pulse 78  Resp 16  Ht 5\' 6"  (1.676 m)  Wt 222 lb (100.699 kg)  BMI 35.85 kg/m2  LMP 01/05/2016  General Appearance:    Alert, cooperative, no distress, appears stated age  Head:    Normocephalic, without obvious abnormality, atraumatic  Eyes:    PERRL, conjunctiva/corneas clear, EOM's intact, fundi    benign, both eyes  Ears:    Normal TM's and external ear canals, both ears  Nose:   Nares normal, septum midline, mucosa normal, no drainage    or sinus tenderness  Throat:   Lips, mucosa, and tongue normal; teeth and gums normal  Neck:   Supple, symmetrical, trachea midline, no adenopathy;    thyroid:  no enlargement/tenderness/nodules; no carotid   bruit or JVD  Back:     Symmetric, no curvature, ROM normal, no CVA tenderness  Lungs:     Clear to auscultation bilaterally,  respirations unlabored  Chest Wall:    No tenderness or deformity   Heart:    Regular rate and rhythm, S1 and S2 normal, no murmur, rub   or gallop  Breast Exam:    No tenderness, masses, or nipple abnormality  Abdomen:     Soft, non-tender, bowel sounds active all four quadrants,    no masses, no organomegaly  Genitalia:    Normal female without lesion, discharge or tenderness, NSSA, NT, mobile, no palpable adnexal masses     Extremities:   Extremities normal, atraumatic, no cyanosis or edema  Pulses:   2+ and symmetric all extremities  Skin:   Skin color, texture, turgor normal, no rashes or lesions  Lymph nodes:   Cervical, supraclavicular, and axillary nodes normal  Neurologic:   CNII-XII intact, normal strength, sensation and reflexes    throughout  .    Assessment:    Healthy female exam.    Plan:     Breast self exam technique reviewed and patient encouraged to perform self-exam monthly. Thin prep Pap smear. with cotesting. (I made her aware of ACOG recs)

## 2016-02-03 LAB — CYTOLOGY - PAP

## 2016-08-03 ENCOUNTER — Emergency Department (INDEPENDENT_AMBULATORY_CARE_PROVIDER_SITE_OTHER)
Admission: EM | Admit: 2016-08-03 | Discharge: 2016-08-03 | Disposition: A | Discharge status: Home / Self Care | Payer: BLUE CROSS/BLUE SHIELD | Source: Home / Self Care | Attending: Emergency Medicine | Admitting: Emergency Medicine

## 2016-08-03 ENCOUNTER — Encounter: Payer: Self-pay | Admitting: *Deleted

## 2016-08-03 DIAGNOSIS — H65111 Acute and subacute allergic otitis media (mucoid) (sanguinous) (serous), right ear: Secondary | ICD-10-CM

## 2016-08-03 DIAGNOSIS — J0100 Acute maxillary sinusitis, unspecified: Secondary | ICD-10-CM | POA: Diagnosis not present

## 2016-08-03 DIAGNOSIS — H109 Unspecified conjunctivitis: Secondary | ICD-10-CM

## 2016-08-03 MED ORDER — POLYMYXIN B-TRIMETHOPRIM 10000-0.1 UNIT/ML-% OP SOLN: OPHTHALMIC | 0 refills | Status: DC

## 2016-08-03 MED ORDER — AMOXICILLIN 875 MG PO TABS: ORAL_TABLET | ORAL | 0 refills | Status: DC

## 2016-08-03 MED ORDER — BENZONATATE 200 MG PO CAPS: ORAL_CAPSULE | ORAL | 0 refills | Status: DC

## 2016-08-03 NOTE — ED Provider Notes (Signed)
[name]    CSN: [id number] Arrival date & time: 08/03/16  [id number]     History   Chief Complaint Chief Complaint  Patient presents with  . Nasal Congestion  . Otalgia  . Sinus Problem    HPI Beverly [name] is a 40 y.o. female.   HPI 5 days of worsening sinus congestion discolored rhinorrhea, sinus pressure, progressed to pressure right ear, associated with fever chills and sweats and matted eye discharge in the morning. No shortness of breath or chest pain or nausea or vomiting. Denies chance of pregnancy. No GYN or GU symptoms Past Medical History:  Diagnosis Date  . Diabetes mellitus   . PCOS (polycystic ovarian syndrome)   . Thyroid disease    hashimoto's    Patient Active Problem List   Diagnosis Date Noted  . Obesity (BMI 30-39.9) 06/24/2014  . HYPOTHYROIDISM 07/09/2010  . DIABETES MELLITUS, TYPE I 07/09/2010  . ACUTE CYSTITIS 07/09/2010  . HEADACHE 07/09/2010    Past Surgical History:  Procedure Laterality Date  . WISDOM TOOTH EXTRACTION      OB History    Gravida Para Term Preterm AB Living   6 4 4   2 4    SAB TAB Ectopic Multiple Live Births     2             Home Medications    Prior to Admission medications   Medication Sig Start Date End Date Taking? Authorizing Provider  AMBULATORY NON FORMULARY MEDICATION Pregnolone 2mg  daily    Historical Provider, MD  amoxicillin (AMOXIL) 875 MG tablet Take 1 twice a day X 10 days. 08/03/16   Jacqulyn Cane, MD  ARMOUR THYROID 30 MG tablet  01/15/16   Historical Provider, MD  Ascorbic Acid (VITAMIN C) 1000 MG tablet Take 1,000 mg by mouth daily.    Historical Provider, MD  benzonatate (TESSALON) 200 MG capsule Take 1 every 8 hours as needed for cough. 08/03/16   Jacqulyn Cane, MD  Cholecalciferol (VITAMIN D PO) Take by mouth.    Historical Provider, MD  fluticasone (FLONASE) 50 MCG/ACT nasal spray Place 2 sprays into both nostrils daily. 10/28/15   Noland Fordyce, PA-C  loratadine (CLARITIN) 10 MG  tablet Take 10 mg by mouth daily.    Historical Provider, MD  Multiple Vitamin (MULTIVITAMIN) tablet Take 1 tablet by mouth daily.    Historical Provider, MD  NON FORMULARY Tumeric    Historical Provider, MD  OVER THE COUNTER MEDICATION Tumeric BID    Historical Provider, MD  trimethoprim-polymyxin b (POLYTRIM) ophthalmic solution 2 drop in affected eye(s) every 4 hours (while awake) x 7 days 08/03/16   Jacqulyn Cane, MD    Family History Family History  Problem Relation Age of Onset  . Cancer Paternal Grandmother     breast  . Cancer Maternal Grandmother     breast CA  . Thyroid disease Maternal Grandmother   . Diabetes Maternal Grandfather     Social History Social History  Substance Use Topics  . Smoking status: Never Smoker  . Smokeless tobacco: Never Used  . Alcohol use Yes     Comment: social     Allergies   Review of patient's allergies indicates no known allergies.   Review of Systems Review of Systems  All other systems reviewed and are negative.    Physical Exam Triage Vital Signs ED Triage Vitals  Enc Vitals Group     BP 08/03/16 0839 127/84     Pulse Rate 08/03/16  0839 73     Resp 08/03/16 0839 16     Temp 08/03/16 0839 98.2 F (36.8 C)     Temp Source 08/03/16 0839 Oral     SpO2 08/03/16 0839 97 %     Weight 08/03/16 0840 213 lb (96.6 kg)     Height --      Head Circumference --      Peak Flow --      Pain Score 08/03/16 0840 4     Pain Loc --      Pain Edu? --      Excl. in [place]? --    No data found.   Updated Vital Signs BP 127/84 (BP Location: Left Arm)   Pulse 73   Temp 98.2 F (36.8 C) (Oral)   Resp 16   Wt 213 lb (96.6 kg)   LMP 07/11/2016   SpO2 97%   BMI 34.38 kg/m   Visual Acuity Right Eye Distance:   Left Eye Distance:   Bilateral Distance:    Right Eye Near:   Left Eye Near:    Bilateral Near:     Physical Exam  Constitutional: She is oriented to person, place, and time. She appears well-developed and  well-nourished. No distress.  HENT:  Head: Normocephalic and atraumatic.  Right Ear: External ear and ear canal normal. Tympanic membrane is erythematous.  Left Ear: Tympanic membrane, external ear and ear canal normal.  Nose: Mucosal edema and rhinorrhea present. Right sinus exhibits maxillary sinus tenderness. Left sinus exhibits maxillary sinus tenderness.  Mouth/Throat: Oropharynx is clear and moist. No oral lesions. No oropharyngeal exudate.  Eyes: EOM are normal. Pupils are equal, round, and reactive to light. Right eye exhibits no discharge. Left eye exhibits no discharge. Right conjunctiva is injected. Right conjunctiva has no hemorrhage. Left conjunctiva is injected. No scleral icterus.  Neck: Neck supple.  Cardiovascular: Normal rate, regular rhythm and normal heart sounds.   Pulmonary/Chest: Effort normal and breath sounds normal. She has no wheezes. She has no rales.  Lymphadenopathy:    She has no cervical adenopathy.  Neurological: She is alert and oriented to person, place, and time.  Skin: Skin is warm and dry. No rash noted.  Nursing note and vitals reviewed.    UC Treatments / Results  Labs (all labs ordered are listed, but only abnormal results are displayed) Labs Reviewed - No data to display  EKG  EKG Interpretation None       Radiology No results found.  Procedures Procedures (including critical care time)  Medications Ordered in UC Medications - No data to display   Initial Impression / Assessment and Plan / UC Course  I have reviewed the triage vital signs and the nursing notes.  Pertinent labs & imaging results that were available during my care of the patient were reviewed by me and considered in my medical decision making (see chart for details).  Clinical Course      Final Clinical Impressions(s) / UC Diagnoses   Final diagnoses:  Acute maxillary sinusitis, recurrence not specified  Acute mucoid otitis media of right ear  Bilateral  conjunctivitis   Treatment options discussed, as well as risks, benefits, alternatives. Patient voiced understanding and agreement with the following plans:  New Prescriptions New Prescriptions   AMOXICILLIN (AMOXIL) 875 MG TABLET    Take 1 twice a day X 10 days.   BENZONATATE (TESSALON) 200 MG CAPSULE    Take 1 every 8 hours as needed for cough.  TRIMETHOPRIM-POLYMYXIN B (POLYTRIM) OPHTHALMIC SOLUTION    2 drop in affected eye(s) every 4 hours (while awake) x 7 days  Follow-up with your primary care doctor in 5-7 days if not improving, or sooner if symptoms become worse. Precautions discussed. Red flags discussed. Work note given at her request Questions invited and answered. Patient voiced understanding and agreement.    Jacqulyn Cane, MD 08/03/16 [phone]

## 2016-08-03 NOTE — ED Triage Notes (Signed)
Pt c/o 4-5 days of sinus pressure, cough, eye drainage, and now right ear pain. Chills and sweats at times. Taken aleve cold and sinus, Mucines and Claritin otc.

## 2017-01-18 ENCOUNTER — Encounter: Payer: Self-pay | Admitting: Emergency Medicine

## 2017-01-18 ENCOUNTER — Telehealth: Payer: Self-pay

## 2017-01-18 ENCOUNTER — Emergency Department (INDEPENDENT_AMBULATORY_CARE_PROVIDER_SITE_OTHER)
Admission: EM | Admit: 2017-01-18 | Discharge: 2017-01-18 | Disposition: A | Discharge status: Home / Self Care | Payer: Commercial Managed Care - PPO | Source: Home / Self Care | Attending: Emergency Medicine | Admitting: Emergency Medicine

## 2017-01-18 DIAGNOSIS — S39012A Strain of muscle, fascia and tendon of lower back, initial encounter: Secondary | ICD-10-CM | POA: Diagnosis not present

## 2017-01-18 DIAGNOSIS — R1011 Right upper quadrant pain: Secondary | ICD-10-CM

## 2017-01-18 LAB — POCT URINALYSIS DIP (MANUAL ENTRY)
Bilirubin, UA: NEGATIVE
Blood, UA: NEGATIVE
Glucose, UA: NEGATIVE
Ketones, POC UA: NEGATIVE
Leukocytes, UA: NEGATIVE
Nitrite, UA: NEGATIVE
Protein Ur, POC: NEGATIVE
Spec Grav, UA: 1.01
Urobilinogen, UA: 0.2
pH, UA: 7

## 2017-01-18 LAB — COMPLETE METABOLIC PANEL WITH GFR
ALT: 29 U/L (ref 6–29)
AST: 22 U/L (ref 10–30)
Albumin: 4.4 g/dL (ref 3.6–5.1)
Alkaline Phosphatase: 48 U/L (ref 33–115)
BUN: 6 mg/dL — ABNORMAL LOW (ref 7–25)
CO2: 24 mmol/L (ref 20–31)
Calcium: 9.8 mg/dL (ref 8.6–10.2)
Chloride: 105 mmol/L (ref 98–110)
Creat: 0.63 mg/dL (ref 0.50–1.10)
GFR, Est African American: >89 mL/min (ref >=60)
GFR, Est Non African American: >89 mL/min (ref >=60)
Glucose, Bld: 99 mg/dL (ref 65–99)
Potassium: 4 mmol/L (ref 3.5–5.3)
Sodium: 140 mmol/L (ref 135–146)
Total Bilirubin: 0.6 mg/dL (ref 0.2–1.2)
Total Protein: 6.6 g/dL (ref 6.1–8.1)

## 2017-01-18 LAB — POCT CBC W AUTO DIFF (K'VILLE URGENT CARE)

## 2017-01-18 LAB — AMYLASE: Amylase: 36 U/L (ref 0–105)

## 2017-01-18 LAB — LIPASE: Lipase: 10 U/L (ref 7–60)

## 2017-01-18 MED ORDER — CYCLOBENZAPRINE HCL 10 MG PO TABS: 10.0000 mg | ORAL_TABLET | Freq: Every day | ORAL | 0 refills | Status: DC

## 2017-01-18 MED ORDER — MELOXICAM 7.5 MG PO TABS: ORAL_TABLET | ORAL | 0 refills | Status: DC

## 2017-01-18 NOTE — Discharge Instructions (Addendum)
Urine test normal. No evidence of urinary infection or kidney stone.. Complete blood count normal today. Were sending to reference lab the following tests: Complete metabolic panel, including liver tests. Lipase and amylase to check for pancreas. Most likely, this is a muscle strain of your right flank. Clinically, no evidence of surgical abdomen. Take medications as prescribed. Call and make an appointment with PCP within 3 days. Emergency room if any severe or worsening symptoms

## 2017-01-18 NOTE — ED Provider Notes (Signed)
[name]    CSN: [id number] Arrival date & time: 01/18/17  1039     History   Chief Complaint Chief Complaint  Patient presents with  . Back Pain    HPI Beverly [name] is a 41 y.o. female.   The history is provided by the patient. No language interpreter was used.  Back Pain  Pain location: Right flank, right upper quadrant. Quality:  Aching Radiates to:  Does not radiate Pain severity:  Moderate Onset quality:  Gradual Duration:  6 days Timing:  Intermittent Progression:  Waxing and waning Chronicity:  New Context: emotional stress   Context: not falling, not lifting heavy objects, not recent illness and not recent injury   Relieved by:  Being still Worsened by:  Bending, movement, deep breathing and palpation Ineffective treatments:  None tried Associated symptoms: abdominal pain (Right upper quadrant) and fever ("Possibly" per patient)   Associated symptoms: no abdominal swelling, no bladder incontinence, no bowel incontinence, no chest pain, no dysuria, no numbness, no paresthesias, no pelvic pain and no weakness   Risk factors: no recent surgery    Patient is vague with her symptoms. She feels the right flank pain, 0 out of 10 at rest, 6 out of 10 with movement. is worse with movement but recalls no injury. No nausea or vomiting or diarrhea. Last menstrual period started last week was moderately crampy but the cramping resolved.. Last menstrual period ended 2 days ago. Denies dysuria or urinary frequency or hematuria. Denies any Gym symptoms currently. Never had a kidney stone.  Denies chance of pregnancy. Husband status post vasectomy. She states she was seen a week ago by her M.D. whom she states is not her PCP, and not an endocrinologist, but follows her for her thyroid . She denies any history of liver disease or gallbladder disease.  Past surgical history: None   She states she's been quite stressed recently. She feels more emotional recently  but denies any suicidal or homicidal ideation.  She is going to school as a stylist currently, and she states that's been stressful, but recalls no specific injury  Past Medical History:  Diagnosis Date  . Diabetes mellitus   . PCOS (polycystic ovarian syndrome)   . Thyroid disease    hashimoto's    Patient Active Problem List   Diagnosis Date Noted  . Obesity (BMI 30-39.9) 06/24/2014  . HYPOTHYROIDISM 07/09/2010  . DIABETES MELLITUS, TYPE I 07/09/2010  . ACUTE CYSTITIS 07/09/2010  . HEADACHE 07/09/2010    Past Surgical History:  Procedure Laterality Date  . WISDOM TOOTH EXTRACTION      OB History    Gravida Para Term Preterm AB Living   6 4 4   2 4    SAB TAB Ectopic Multiple Live Births     2             Home Medications    Prior to Admission medications   Medication Sig Start Date End Date Taking? Authorizing Provider  AMBULATORY NON FORMULARY MEDICATION Pregnolone 2mg  daily    Historical Provider, MD  ARMOUR THYROID 30 MG tablet  01/15/16   Historical Provider, MD  Ascorbic Acid (VITAMIN C) 1000 MG tablet Take 1,000 mg by mouth daily.    Historical Provider, MD  Cholecalciferol (VITAMIN D PO) Take by mouth.    Historical Provider, MD  loratadine (CLARITIN) 10 MG tablet Take 10 mg by mouth daily.    Historical Provider, MD  Multiple Vitamin (MULTIVITAMIN) tablet Take 1 tablet  by mouth daily.    Historical Provider, MD  NON FORMULARY Tumeric    Historical Provider, MD  OVER THE COUNTER MEDICATION Tumeric BID    Historical Provider, MD    Family History Family History  Problem Relation Age of Onset  . Cancer Paternal Grandmother     breast  . Cancer Maternal Grandmother     breast CA  . Thyroid disease Maternal Grandmother   . Diabetes Maternal Grandfather     Social History Social History  Substance Use Topics  . Smoking status: Never Smoker  . Smokeless tobacco: Never Used  . Alcohol use Yes     Comment: social     Allergies   Patient has no known  allergies.   Review of Systems Review of Systems  Constitutional: Positive for fever ("Possibly" per patient).  Cardiovascular: Negative for chest pain.  Gastrointestinal: Positive for abdominal pain (Right upper quadrant). Negative for bowel incontinence.  Genitourinary: Negative for bladder incontinence, dysuria and pelvic pain.  Musculoskeletal: Positive for back pain.  Neurological: Negative for weakness, numbness and paresthesias.  All other systems reviewed and are negative.   see history of present illness   Physical Exam Triage Vital Signs ED Triage Vitals  Enc Vitals Group     BP 01/18/17 1107 123/86     Pulse Rate 01/18/17 1107 73     Resp --      Temp 01/18/17 1107 97.6 F (36.4 C)     Temp Source 01/18/17 1107 Oral     SpO2 01/18/17 1107 96 %     Weight 01/18/17 1107 220 lb (99.8 kg)     Height 01/18/17 1107 5\' 6"  (1.676 m)     Head Circumference --      Peak Flow --      Pain Score 01/18/17 1110 5     Pain Loc --      Pain Edu? --      Excl. in [place]? --    No data found.   Updated Vital Signs BP 123/86 (BP Location: Left Arm)   Pulse 73   Temp 97.6 F (36.4 C) (Oral)   Ht 5\' 6"  (1.676 m)   Wt 220 lb (99.8 kg)   LMP 01/13/2017 (Exact Date)   SpO2 96%   BMI 35.51 kg/m       Physical Exam  Constitutional: She is oriented to person, place, and time. She appears well-developed and well-nourished. No distress.  HENT:  Head: Normocephalic and atraumatic.  Mouth/Throat: Oropharynx is clear and moist.  Eyes: Conjunctivae are normal. Pupils are equal, round, and reactive to light. No scleral icterus.  Neck: Neck supple. No JVD present.  Cardiovascular: Normal rate, regular rhythm and normal heart sounds.   No murmur heard. Pulmonary/Chest: Effort normal and breath sounds normal. She has no wheezes. She has no rales. She exhibits tenderness (Mild tenderness right costal area, pain exacerbates with deep breath. Equal expansion on auscultation.).    Abdominal: Soft. Bowel sounds are normal. She exhibits no distension and no mass. There is tenderness (Right upper quadrant, soft tissue and deep, just below right costal margin. Exacerbated with deep breath or with torsion). There is no rebound and no guarding. No hernia.  No right lower quadrant tenderness. No hepatosplenomegaly or masses  Musculoskeletal: Normal range of motion.  Lymphadenopathy:    She has no cervical adenopathy.  Neurological: She is alert and oriented to person, place, and time. No cranial nerve deficit or sensory deficit. She exhibits normal  muscle tone.  Skin: Skin is warm and dry. No rash noted. She is not diaphoretic.  Psychiatric: She has a normal mood and affect.  Nursing note and vitals reviewed.  She declined any imaging today.  UC Treatments / Results  Labs (all labs ordered are listed, but only abnormal results are displayed) Labs Reviewed  COMPLETE METABOLIC PANEL WITH GFR  AMYLASE  LIPASE  POCT URINALYSIS DIP (MANUAL ENTRY)  POCT CBC W AUTO DIFF ([facility])    EKG  EKG Interpretation None       Radiology No results found.  Procedures Procedures (including critical care time)  Medications Ordered in UC Medications - No data to display   Initial Impression / Assessment and Plan / UC Course  I have reviewed the triage vital signs and the nursing notes.  Pertinent labs & imaging results that were available during my care of the patient were reviewed by me and considered in my medical decision making (see chart for details).    CBC WNL. Wbc 6.2 Hgb 14.1 UA normal. Urine cx sent.  Final Clinical Impressions(s) / UC Diagnoses R flank and RUQ pain, likely muscular strain. No evidence of pulmonary, GU cause or acute abdomen. Much less likely, could be gallbladder, but pain worsens with movement and palpation, not with food.   Treatment options discussed, as well as risks, benefits, alternatives. Patient voiced understanding  and agreement with the following plans: cmp, lipase, amylase ordered.  New Prescriptions- meloxicam, 7.5 mg BID pc. Flexeril 10 mg hs        An After Visit Summary was printed and given to the patient. "Urine test normal. No evidence of urinary infection or kidney stone.. Complete blood count normal today. Were sending to reference lab the following tests: Complete metabolic panel, including liver tests. Lipase and amylase to check for pancreas. Most likely, this is a muscle strain of your right flank. Clinically, no evidence of surgical abdomen. Take medications as prescribed. Call and make an appointment with PCP within 3 days. Emergency room if any severe or worsening symptoms"  Precautions discussed. Red flags discussed. Questions invited and answered. Patient voiced understanding and agreement.    Jacqulyn Cane, MD 01/19/17 2114

## 2017-01-18 NOTE — Telephone Encounter (Signed)
Left VM with lab results and that Dr. Burnett Harry feels that the liver and pancreas are fine per lab results.  Pt can call UC and follow up if questions or concerns.

## 2017-01-18 NOTE — ED Triage Notes (Signed)
LBP x 1 week today rt flank pain denies dysuria or polyuria.

## 2017-06-04 LAB — HEPATIC FUNCTION PANEL
ALT: 17 (ref 7–35)
AST: 14 (ref 13–35)

## 2017-06-04 LAB — BASIC METABOLIC PANEL
BUN: 10 (ref 4–21)
CREATININE: 0.8 (ref 0.5–1.1)

## 2017-06-04 LAB — TSH: TSH: 7.78 — AB (ref 0.41–5.90)

## 2017-07-23 ENCOUNTER — Encounter: Payer: Self-pay | Admitting: Osteopathic Medicine

## 2017-07-23 ENCOUNTER — Ambulatory Visit (INDEPENDENT_AMBULATORY_CARE_PROVIDER_SITE_OTHER): Payer: Commercial Managed Care - PPO | Admitting: Osteopathic Medicine

## 2017-07-23 VITALS — BP 128/84 | HR 68 | Ht 66.0 in | Wt 216.0 lb

## 2017-07-23 DIAGNOSIS — Z0000 Encounter for general adult medical examination without abnormal findings: Secondary | ICD-10-CM | POA: Diagnosis not present

## 2017-07-23 DIAGNOSIS — E039 Hypothyroidism, unspecified: Secondary | ICD-10-CM

## 2017-07-23 DIAGNOSIS — Z9189 Other specified personal risk factors, not elsewhere classified: Secondary | ICD-10-CM

## 2017-07-23 DIAGNOSIS — E119 Type 2 diabetes mellitus without complications: Secondary | ICD-10-CM

## 2017-07-23 DIAGNOSIS — E282 Polycystic ovarian syndrome: Secondary | ICD-10-CM

## 2017-07-23 DIAGNOSIS — Z8632 Personal history of gestational diabetes: Secondary | ICD-10-CM | POA: Diagnosis not present

## 2017-07-23 DIAGNOSIS — R7303 Prediabetes: Secondary | ICD-10-CM | POA: Diagnosis not present

## 2017-07-23 LAB — POCT GLYCOSYLATED HEMOGLOBIN (HGB A1C): HEMOGLOBIN A1C: 6.1

## 2017-07-23 MED ORDER — THYROID 60 MG PO TABS
60.0000 mg | ORAL_TABLET | Freq: Every day | ORAL | 3 refills | Status: DC
Start: 2017-07-23 — End: 2018-08-12

## 2017-07-23 NOTE — Progress Notes (Signed)
HPI: Beverly [name] is a 41 y.o. female  who presents to [place] today, 07/23/17,  for chief complaint of:  Chief Complaint  Patient presents with  . Establish Care    Patient here for annual physical / wellness exam.  See preventive care reviewed as below.  Recent labs reviewed in detail with the patient - what was available. She states recent labs with [facility] but no results are currently available.   Additional concerns today include: Review of chronic medical issues    Sugars: DM1 was on problem list in error, A1C indicates pre-diabetes. Known to patient. History GDM.    Thyroid: following with [facility]     Past medical, surgical, social and family history reviewed: Patient Active Problem List   Diagnosis Date Noted  . Obesity (BMI 30-39.9) 06/24/2014  . HYPOTHYROIDISM 07/09/2010  . DIABETES MELLITUS, TYPE I 07/09/2010  . ACUTE CYSTITIS 07/09/2010  . HEADACHE 07/09/2010   Past Surgical History:  Procedure Laterality Date  . WISDOM TOOTH EXTRACTION     Social History  Substance Use Topics  . Smoking status: Never Smoker  . Smokeless tobacco: Never Used  . Alcohol use Yes     Comment: social   Family History  Problem Relation Age of Onset  . Cancer Paternal Grandmother        breast  . Cancer Maternal Grandmother        breast CA  . Thyroid disease Maternal Grandmother   . Diabetes Maternal Grandfather      Current medication list and allergy/intolerance information reviewed:   Current Outpatient Prescriptions  Medication Sig Dispense Refill  . ARMOUR THYROID 30 MG tablet     . loratadine (CLARITIN) 10 MG tablet Take 10 mg by mouth daily.     No current facility-administered medications for this visit.    No Known Allergies    Review of Systems:  Constitutional:  No  fever, no chills, No recent illness, No unintentional weight changes. No significant fatigue.   HEENT: No  headache, no vision  change, no hearing change, No sore throat, No  sinus pressure  Cardiac: No  chest pain, No  pressure, No palpitations, No  Orthopnea  Respiratory:  No  shortness of breath. No  Cough  Gastrointestinal: No  abdominal pain, No  nausea, No  vomiting,  No  blood in stool, No  diarrhea, No  constipation   Musculoskeletal: No new myalgia/arthralgia  Genitourinary: No  incontinence, No  abnormal genital bleeding, No abnormal genital discharge  Skin: No  Rash, No other wounds/concerning lesions  Hem/Onc: No  easy bruising/bleeding, No  abnormal lymph node  Endocrine: No cold intolerance,  No heat intolerance. No polyuria/polydipsia/polyphagia   Neurologic: No  weakness, No  dizziness, No  slurred speech/focal weakness/facial droop  Psychiatric: No  concerns with depression, No  concerns with anxiety, No sleep problems, No mood problems   Exam:  BP 128/84   Pulse 68   Ht 5\' 6"  (1.676 m)   Wt 216 lb (98 kg)   BMI 34.86 kg/m   Constitutional: VS see above. General Appearance: alert, well-developed, well-nourished, NAD  Eyes: Normal lids and conjunctive, non-icteric sclera  Ears, Nose, Mouth, Throat: MMM, Normal external inspection ears/nares/mouth/lips/gums. TM normal bilaterally. Pharynx/tonsils no erythema, no exudate. Nasal mucosa normal.   Neck: No masses, trachea midline. No thyroid enlargement.   Respiratory: Normal respiratory effort. no wheeze, no rhonchi, no rales  Cardiovascular: S1/S2 normal, no murmur, no rub/gallop  auscultated. RRR. No lower extremity edema.   Gastrointestinal: Nontender, no masses. No hepatomegaly, no splenomegaly. No hernia appreciated. Bowel sounds normal. Rectal exam deferred.   Musculoskeletal: Gait normal. No clubbing/cyanosis of digits.   Neurological: Normal balance/coordination. No tremor.   Skin: warm, dry, intact. No rash/ulcer.    Psychiatric: Normal judgment/insight. Normal mood and affect. Oriented x3.    Results for orders placed  or performed in visit on 07/23/17 (from the past 72 hour(s))  POCT HgB A1C     Status: None   Collection Time: 07/23/17 10:28 AM  Result Value Ref Range   Hemoglobin A1C 6.1       ASSESSMENT/PLAN:   Annual physical exam  Prediabetes - Plan: POCT HgB A1C  Hypothyroidism, unspecified type  PCOS (polycystic ovarian syndrome)  Relies on partner vasectomy for contraception  History of gestational diabetes    FEMALE PREVENTIVE CARE Updated 07/23/17   ANNUAL SCREENING/COUNSELING  Diet/Exercise - HEALTHY HABITS DISCUSSED TO DECREASE CV RISK History  Smoking Status  . Never Smoker  Smokeless Tobacco  . Never Used   History  Alcohol Use  . Yes    Comment: social   Depression screen [facility] 2/9 07/23/2017  Decreased Interest 0  Down, Depressed, Hopeless 0  PHQ - 2 Score 0    Domestic violence concerns - no  HTN SCREENING - SEE [place]  Sexually active in the past year - Yes with female.  Need/want STI testing today? - no  Concerns about libido or pain with sex? - no  Plans for pregnancy? - vasectomy   INFECTIOUS DISEASE SCREENING  HIV - needs  GC/CT - does not need  HepC - DOB [birth year] - does not need  TB - does not need  DISEASE SCREENING  Lipid - needs  DM2 - does not need  Osteoporosis - women age 58+ - does not need  CANCER SCREENING  Cervical - does not need  Breast - needs  Lung - does not need  Colon - does not need  ADULT VACCINATION  Influenza - annual vaccine recommended  Td - booster every 10 years   Zoster - option at 37, yes at 60+   PCV13 - was not indicated  PPSV23 - was not indicated  There is no immunization history on file for this patient.   Patient Instructions  Plan: Will request records from Dr. Judeen Hammans for lab results Plan to see me in a year for annual, sooner if needed Any questions, please let us know!     Visit summary with medication list and pertinent instructions was printed  for patient to review. All questions at time of visit were answered - patient instructed to contact office with any additional concerns. ER/RTC precautions were reviewed with the patient. Follow-up plan: Return in about 1 year (around 07/23/2018) for [place], sooner if needed.

## 2017-07-23 NOTE — Patient Instructions (Signed)
Plan: Will request records from Dr. Judeen Hammans for lab results Plan to see me in a year for annual, sooner if needed Any questions, please let us know!

## 2017-08-20 ENCOUNTER — Encounter: Payer: Self-pay | Admitting: Osteopathic Medicine

## 2018-01-04 ENCOUNTER — Other Ambulatory Visit: Payer: Self-pay

## 2018-01-04 ENCOUNTER — Emergency Department (INDEPENDENT_AMBULATORY_CARE_PROVIDER_SITE_OTHER)
Admission: EM | Admit: 2018-01-04 | Discharge: 2018-01-04 | Disposition: A | Discharge status: Home / Self Care | Payer: Commercial Managed Care - PPO | Source: Home / Self Care | Attending: Family Medicine | Admitting: Family Medicine

## 2018-01-04 DIAGNOSIS — J069 Acute upper respiratory infection, unspecified: Secondary | ICD-10-CM | POA: Diagnosis not present

## 2018-01-04 DIAGNOSIS — B9789 Other viral agents as the cause of diseases classified elsewhere: Secondary | ICD-10-CM | POA: Diagnosis not present

## 2018-01-04 DIAGNOSIS — M940 Chondrocostal junction syndrome [Tietze]: Secondary | ICD-10-CM

## 2018-01-04 MED ORDER — BENZONATATE 200 MG PO CAPS: ORAL_CAPSULE | ORAL | 0 refills | Status: DC

## 2018-01-04 MED ORDER — PREDNISONE 20 MG PO TABS: ORAL_TABLET | ORAL | 0 refills | Status: DC

## 2018-01-04 MED ORDER — AZITHROMYCIN 250 MG PO TABS: ORAL_TABLET | ORAL | 0 refills | Status: DC

## 2018-01-04 NOTE — Discharge Instructions (Signed)
Take plain guaifenesin (1200mg extended release tabs such as Mucinex) twice daily, with plenty of water, for cough and congestion.  May add Pseudoephedrine (30mg, one or two every 4 to 6 hours) for sinus congestion.  Get adequate rest.   °May use Afrin nasal spray (or generic oxymetazoline) each morning for about 5 days and then discontinue.  Also recommend using saline nasal spray several times daily and saline nasal irrigation (AYR is a common brand).  Use Flonase nasal spray each morning after using Afrin nasal spray and saline nasal irrigation. °Try warm salt water gargles for sore throat.  °Stop all antihistamines for now, and other non-prescription cough/cold preparations. °May take Delsym Cough Suppressant with Tessalon at bedtime for nighttime cough.  °Begin Azithromycin if not improving about one week or if persistent fever develops   °

## 2018-01-04 NOTE — ED Triage Notes (Signed)
Felt like she has been battling allergies for a couple of weeks, yesterday started with tightness in her chest, and coughing last night.  This morning has burning from throat down into her chest.  Denies drainage.

## 2018-01-04 NOTE — ED Provider Notes (Signed)
[name]    CSN: [id number] Arrival date & time: 01/04/18  0834     History   Chief Complaint Chief Complaint  Patient presents with  . Facial Pain  . Ear Fullness  . Cough    HPI Beverly [name] is a 42 y.o. female.   Patient has felt vague tightness in her anterior chest and almost feels short of breath when she takes deep inspirations.  She has perennial rhinitis and recently has been taking xyzal. Last night she developed a cough, increased sinus pressure, itching in her ears, myalgias, and sensation or warmth.  Her throat feels scratchy.   The history is provided by the patient.    Past Medical History:  Diagnosis Date  . Diabetes mellitus   . PCOS (polycystic ovarian syndrome)   . Thyroid disease    hashimoto's    Patient Active Problem List   Diagnosis Date Noted  . PCOS (polycystic ovarian syndrome) 07/23/2017  . Relies on partner vasectomy for contraception 07/23/2017  . Prediabetes 07/23/2017  . History of gestational diabetes 07/23/2017  . Obesity (BMI 30-39.9) 06/24/2014  . Hypothyroidism 07/09/2010    Past Surgical History:  Procedure Laterality Date  . WISDOM TOOTH EXTRACTION      OB History    Gravida Para Term Preterm AB Living   6 4 4   2 4    SAB TAB Ectopic Multiple Live Births     2             Home Medications    Prior to Admission medications   Medication Sig Start Date End Date Taking? Authorizing Provider  azithromycin (ZITHROMAX Z-PAK) 250 MG tablet Take 2 tabs today; then begin one tab once daily for 4 more days. (Rx void after 01/12/18) 01/04/18   Kandra Nicolas, MD  benzonatate (TESSALON) 200 MG capsule Take one cap by mouth at bedtime as needed for cough.  May repeat in 4 to 6 hours 01/04/18   Kandra Nicolas, MD  loratadine (CLARITIN) 10 MG tablet Take 10 mg by mouth daily.    [provider]  predniSONE (DELTASONE) 20 MG tablet Take one tab by mouth twice daily for 4 days, then one daily for 3 days.  Take with food. 01/04/18   Kandra Nicolas, MD  thyroid [facility] THYROID) 60 MG tablet Take 1 tablet (60 mg total) by mouth daily before breakfast. Take additional 1/2 tablet Mondays and Thursdays (90 mg total) 07/23/17   Emeterio Reeve, DO    Family History Family History  Problem Relation Age of Onset  . Thyroid disease Mother   . Cancer Paternal Grandmother        breast  . Cancer Maternal Grandmother        breast CA  . Thyroid disease Maternal Grandmother   . Diabetes Maternal Grandfather     Social History Social History   Tobacco Use  . Smoking status: Never Smoker  . Smokeless tobacco: Never Used  Substance Use Topics  . Alcohol use: Yes    Comment: social  . Drug use: No     Allergies   Eggs or egg-derived products and Metformin and related   Review of Systems Review of Systems ? sore throat + cough No pleuritic pain, but complains of pain over sternum with deep inspiration and cough. No wheezing + nasal congestion + post-nasal drainage No sinus pain/pressure No itchy/red eyes No earache No hemoptysis ? SOB No fever, + chills No nausea No vomiting  No abdominal pain No diarrhea No urinary symptoms No skin rash + fatigue No myalgias + headache Used OTC meds without relief   Physical Exam Triage Vital Signs ED Triage Vitals  Enc Vitals Group     BP 01/04/18 0855 133/85     Pulse Rate 01/04/18 0855 81     Resp --      Temp 01/04/18 0855 98.1 F (36.7 C)     Temp Source 01/04/18 0855 Oral     SpO2 01/04/18 0855 97 %     Weight 01/04/18 0856 206 lb (93.4 kg)     Height 01/04/18 0856 5\' 6"  (1.676 m)     Head Circumference --      Peak Flow --      Pain Score 01/04/18 0855 5     Pain Loc --      Pain Edu? --      Excl. in [place]? --    No data found.  Updated Vital Signs BP 133/85 (BP Location: Right Arm)   Pulse 81   Temp 98.1 F (36.7 C) (Oral)   Ht 5\' 6"  (1.676 m)   Wt 206 lb (93.4 kg)   LMP 12/08/2017   SpO2 97%   BMI 33.25  kg/m   Visual Acuity Right Eye Distance:   Left Eye Distance:   Bilateral Distance:    Right Eye Near:   Left Eye Near:    Bilateral Near:     Physical Exam Nursing notes and Vital Signs reviewed. Appearance:  Patient appears stated age, and in no acute distress Eyes:  Pupils are equal, round, and reactive to light and accomodation.  Extraocular movement is intact.  Conjunctivae are not inflamed  Ears:  Canals normal.  Tympanic membranes normal.  Nose:  Mildly congested turbinates.  No sinus tenderness.  Pharynx:  Normal Neck:  Supple.  Enlarged posterior/lateral nodes are palpated bilaterally, tender to palpation on the left.   Lungs:  Clear to auscultation.  Breath sounds are equal.  Moving air well. Chest:  Distinct tenderness to palpation over the mid-sternum.  Heart:  Regular rate and rhythm without murmurs, rubs, or gallops.  Abdomen:  Nontender without masses or hepatosplenomegaly.  Bowel sounds are present.  No CVA or flank tenderness.  Extremities:  No edema.  Skin:  No rash present.    UC Treatments / Results  Labs (all labs ordered are listed, but only abnormal results are displayed) Labs Reviewed - No data to display  EKG  EKG Interpretation None       Radiology No results found.  Procedures Procedures (including critical care time)  Medications Ordered in UC Medications - No data to display   Initial Impression / Assessment and Plan / UC Course  I have reviewed the triage vital signs and the nursing notes.  Pertinent labs & imaging results that were available during my care of the patient were reviewed by me and considered in my medical decision making (see chart for details).    There is no evidence of bacterial infection today.  Treat symptomatically for now  Begin prednisone burst/taper. Prescription written for Benzonatate [facility]) to take at bedtime for night-time cough.  Take plain guaifenesin (1200mg  extended release tabs such as  Mucinex) twice daily, with plenty of water, for cough and congestion.  May add Pseudoephedrine (30mg , one or two every 4 to 6 hours) for sinus congestion.  Get adequate rest.   May use Afrin nasal spray (or generic oxymetazoline) each morning for  about 5 days and then discontinue.  Also recommend using saline nasal spray several times daily and saline nasal irrigation (AYR is a common brand).  Use Flonase nasal spray each morning after using Afrin nasal spray and saline nasal irrigation. Try warm salt water gargles for sore throat.  Stop all antihistamines for now, and other non-prescription cough/cold preparations. May take Delsym Cough Suppressant with Tessalon at bedtime for nighttime cough.  Begin Azithromycin if not improving about one week or if persistent fever develops (Given a prescription to hold, with an expiration date)  Followup with Family Doctor if not improved in about 10 days.     Final Clinical Impressions(s) / UC Diagnoses   Final diagnoses:  Viral URI with cough  Costochondritis    ED Discharge Orders        Ordered    benzonatate (TESSALON) 200 MG capsule     01/04/18 0920    predniSONE (DELTASONE) 20 MG tablet     01/04/18 0920    azithromycin (ZITHROMAX Z-PAK) 250 MG tablet     01/04/18 0921          Kandra Nicolas, MD 01/04/18 [phone]

## 2018-07-25 ENCOUNTER — Other Ambulatory Visit (HOSPITAL_COMMUNITY): Payer: Self-pay | Admitting: Obstetrics & Gynecology

## 2018-07-25 ENCOUNTER — Encounter: Payer: Self-pay | Admitting: Osteopathic Medicine

## 2018-07-25 ENCOUNTER — Ambulatory Visit (INDEPENDENT_AMBULATORY_CARE_PROVIDER_SITE_OTHER): Payer: Commercial Managed Care - PPO | Admitting: Osteopathic Medicine

## 2018-07-25 VITALS — BP 128/80 | HR 82 | Temp 98.1°F | Wt 223.4 lb

## 2018-07-25 DIAGNOSIS — E039 Hypothyroidism, unspecified: Secondary | ICD-10-CM | POA: Diagnosis not present

## 2018-07-25 DIAGNOSIS — Z1231 Encounter for screening mammogram for malignant neoplasm of breast: Secondary | ICD-10-CM

## 2018-07-25 DIAGNOSIS — Z0000 Encounter for general adult medical examination without abnormal findings: Secondary | ICD-10-CM

## 2018-07-25 DIAGNOSIS — R7303 Prediabetes: Secondary | ICD-10-CM | POA: Diagnosis not present

## 2018-07-25 DIAGNOSIS — Z8632 Personal history of gestational diabetes: Secondary | ICD-10-CM | POA: Diagnosis not present

## 2018-07-25 LAB — POCT GLYCOSYLATED HEMOGLOBIN (HGB A1C): Hemoglobin A1C: 5.4 % (ref 4.0–5.6)

## 2018-07-25 NOTE — Progress Notes (Signed)
HPI: Beverly [name] is a 42 y.o. female  who presents to [facility] today, 07/25/18,  for chief complaint of:  Annual Physical    Patient here for annual physical / wellness exam.  See preventive care reviewed as below.   Additional concerns today include: Review of chronic medical issues    Sugars: DM1 was previously on problem list in error, A1C indicates pre-diabetes. Known to patient. History GDM.    Thyroid: following with [facility] -patient states labs recently checked but I do not have any records available right now    Past medical, surgical, social and family history reviewed: Patient Active Problem List   Diagnosis Date Noted  . PCOS (polycystic ovarian syndrome) 07/23/2017  . Relies on partner vasectomy for contraception 07/23/2017  . Prediabetes 07/23/2017  . History of gestational diabetes 07/23/2017  . Obesity (BMI 30-39.9) 06/24/2014  . Hypothyroidism 07/09/2010   Past Surgical History:  Procedure Laterality Date  . WISDOM TOOTH EXTRACTION     Social History   Tobacco Use  . Smoking status: Never Smoker  . Smokeless tobacco: Never Used  Substance Use Topics  . Alcohol use: Yes    Comment: social   Family History  Problem Relation Age of Onset  . Thyroid disease Mother   . Cancer Paternal Grandmother        breast  . Cancer Maternal Grandmother        breast CA  . Thyroid disease Maternal Grandmother   . Diabetes Maternal Grandfather      Current medication list and allergy/intolerance information reviewed:    Allergies  Allergen Reactions  . Eggs Or Egg-Derived Products Rash  . Metformin And Related Diarrhea      There is no immunization history on file for this patient.  Review of Systems:  Constitutional:  No  fever, no chills, No recent illness, No unintentional weight changes. No significant fatigue.   HEENT: No  headache, no vision change, no hearing change, No sore throat, No  sinus  pressure  Cardiac: No  chest pain, No  pressure, No palpitations, No  Orthopnea  Respiratory:  No  shortness of breath. No  Cough  Gastrointestinal: No  abdominal pain, No  nausea, No  vomiting,  No  blood in stool, No  diarrhea, No  constipation   Musculoskeletal: No new myalgia/arthralgia  Genitourinary: No  incontinence, No  abnormal genital bleeding, No abnormal genital discharge  Skin: No  Rash, No other wounds/concerning lesions  Hem/Onc: No  easy bruising/bleeding, No  abnormal lymph node  Endocrine: No cold intolerance,  No heat intolerance. No polyuria/polydipsia/polyphagia   Neurologic: No  weakness, No  dizziness, No  slurred speech/focal weakness/facial droop  Psychiatric: No  concerns with depression, No  concerns with anxiety, No sleep problems, No mood problems   Exam:  BP 128/80 (BP Location: Left Arm, Patient Position: Sitting, Cuff Size: Normal)   Pulse 82   Temp 98.1 F (36.7 C) (Oral)   Wt 223 lb 6.4 oz (101.3 kg)   BMI 36.06 kg/m   Constitutional: VS see above. General Appearance: alert, well-developed, well-nourished, NAD  Eyes: Normal lids and conjunctive, non-icteric sclera  Ears, Nose, Mouth, Throat: MMM, Normal external inspection ears/nares/mouth/lips/gums. TM normal bilaterally. Pharynx/tonsils no erythema, no exudate. Nasal mucosa normal.   Neck: No masses, trachea midline. No thyroid enlargement.   Respiratory: Normal respiratory effort. no wheeze, no rhonchi, no rales  Cardiovascular: S1/S2 normal, no murmur, no rub/gallop auscultated. RRR. No  lower extremity edema.   Gastrointestinal: Nontender, no masses. No hepatomegaly, no splenomegaly. No hernia appreciated. Bowel sounds normal. Rectal exam deferred.   Musculoskeletal: Gait normal. No clubbing/cyanosis of digits.   Neurological: Normal balance/coordination. No tremor.   Skin: warm, dry, intact. No rash/ulcer.    Psychiatric: Normal judgment/insight. Normal mood and affect.  Oriented x3.    Results for orders placed or performed in visit on 07/25/18 (from the past 72 hour(s))  POCT HgB A1C     Status: None   Collection Time: 07/25/18  3:13 PM  Result Value Ref Range   Hemoglobin A1C 5.4 4.0 - 5.6 %   HbA1c POC (<> result, manual entry)     HbA1c, POC (prediabetic range)     HbA1c, POC (controlled diabetic range)         ASSESSMENT/PLAN:   Annual physical exam  Hypothyroidism, unspecified type  Prediabetes - Plan: POCT HgB A1C  History of gestational diabetes    Patient Instructions  General Preventive Care  Most recent routine screening lipids/other labs: will get records. Cholesterol and Diabetes screening usually recommended annually.   Everyone should have blood pressure checked once per year.   Tobacco: don't! Alcohol: responsible moderation is ok for most adults - if you have concerns about your alcohol intake, please talk to me! Recreational/Illicit Drugs: don't!  Exercise: as tolerated to reduce risk of cardiovascular disease and diabetes. Strength training will also prevent osteoporosis.   Mental health: if need for mental health care (medicines, counseling, other), or concerns about moods, please let me know!   Sexual health: if need for STD testing, or if concerns with libido/pain problems, please let me know! If you need to discuss your birth control options, please let me know!  Vaccines  Flu vaccine: recommended for almost everyone, every fall (by Halloween! Flu is scary!), especially if you are pregnant, if you have exposure to the public, if you're around young children or the elderly, or if you're around pregnant people.   Shingles vaccine: Shingrix recommended after age 81   Pneumonia vaccines: Prevnar and Pneumovax recommended after age 39, sooner if diabetes, COPD/asthma, others  Tetanus booster: Tdap recommended every 10 years - will wait until you step on something!  Cancer screenings   Colon cancer screening:  recommended for everyone at age 69, but some folks need a colonoscopy sooner if risk factors   Prostate cancer screening: recommendations vary, optional PSA blood test for men around age 108  Breast cancer screening: mammogram recommended at age 75 every other year at least, and annually after age 56.   Cervical cancer screening: every 1 to 5 years depending on age and other risk factors. Can stop at age 16 or w/ hysterectomy as long as previous testing was normal.  Infection screenings . HIV: recommended screening at least once age 52-65, more often if risk factors  . Gonorrhea/Chlamydia: screening as needed, though many insurances require testing for anyone on birth control pills . Hepatitis C: recommended for anyone born 65-1965 . TB: certain at-risk populations, or depending on work requirements and/or travel history Other . Bone Density Test: recommended for women at age 69, sooner depending on risk factors . Advanced Directive: Living Will and/or Healthcare Power of Attorney recommended for all adults, regardless of age or health!    Visit summary with medication list and pertinent instructions was printed for patient to review. All questions at time of visit were answered - patient instructed to contact office with any additional concerns.  ER/RTC precautions were reviewed with the patient. Follow-up plan: No follow-ups on file.

## 2018-07-25 NOTE — Patient Instructions (Addendum)
General Preventive Care  Most recent routine screening lipids/other labs: will get records. Cholesterol and Diabetes screening usually recommended annually.   Everyone should have blood pressure checked once per year.   Tobacco: don't! Alcohol: responsible moderation is ok for most adults - if you have concerns about your alcohol intake, please talk to me! Recreational/Illicit Drugs: don't!  Exercise: as tolerated to reduce risk of cardiovascular disease and diabetes. Strength training will also prevent osteoporosis.   Mental health: if need for mental health care (medicines, counseling, other), or concerns about moods, please let me know!   Sexual health: if need for STD testing, or if concerns with libido/pain problems, please let me know! If you need to discuss your birth control options, please let me know!  Vaccines  Flu vaccine: recommended for almost everyone, every fall (by Halloween! Flu is scary!), especially if you are pregnant, if you have exposure to the public, if you're around young children or the elderly, or if you're around pregnant people.   Shingles vaccine: Shingrix recommended after age 53   Pneumonia vaccines: Prevnar and Pneumovax recommended after age 62, sooner if diabetes, COPD/asthma, others  Tetanus booster: Tdap recommended every 10 years - will wait until you step on something!  Cancer screenings   Colon cancer screening: recommended for everyone at age 62, but some folks need a colonoscopy sooner if risk factors   Prostate cancer screening: recommendations vary, optional PSA blood test for men around age 19  Breast cancer screening: mammogram recommended at age 5 every other year at least, and annually after age 90.   Cervical cancer screening: every 1 to 5 years depending on age and other risk factors. Can stop at age 68 or w/ hysterectomy as long as previous testing was normal.  Infection screenings . HIV: recommended screening at least once age  31-65, more often if risk factors  . Gonorrhea/Chlamydia: screening as needed, though many insurances require testing for anyone on birth control pills . Hepatitis C: recommended for anyone born 72-1965 . TB: certain at-risk populations, or depending on work requirements and/or travel history Other . Bone Density Test: recommended for women at age 90, sooner depending on risk factors . Advanced Directive: Living Will and/or Healthcare Power of Attorney recommended for all adults, regardless of age or health!

## 2018-07-26 ENCOUNTER — Encounter: Payer: Self-pay | Admitting: Osteopathic Medicine

## 2018-08-08 ENCOUNTER — Ambulatory Visit (INDEPENDENT_AMBULATORY_CARE_PROVIDER_SITE_OTHER): Payer: Commercial Managed Care - PPO

## 2018-08-08 DIAGNOSIS — Z1231 Encounter for screening mammogram for malignant neoplasm of breast: Secondary | ICD-10-CM

## 2018-08-12 ENCOUNTER — Ambulatory Visit (INDEPENDENT_AMBULATORY_CARE_PROVIDER_SITE_OTHER): Payer: Commercial Managed Care - PPO | Admitting: Obstetrics & Gynecology

## 2018-08-12 ENCOUNTER — Encounter: Payer: Self-pay | Admitting: Obstetrics & Gynecology

## 2018-08-12 VITALS — BP 121/78 | HR 76 | Resp 16 | Ht 66.0 in | Wt 219.0 lb

## 2018-08-12 DIAGNOSIS — Z124 Encounter for screening for malignant neoplasm of cervix: Secondary | ICD-10-CM | POA: Diagnosis not present

## 2018-08-12 DIAGNOSIS — N921 Excessive and frequent menstruation with irregular cycle: Secondary | ICD-10-CM | POA: Diagnosis not present

## 2018-08-12 DIAGNOSIS — Z01419 Encounter for gynecological examination (general) (routine) without abnormal findings: Secondary | ICD-10-CM

## 2018-08-12 DIAGNOSIS — Z1151 Encounter for screening for human papillomavirus (HPV): Secondary | ICD-10-CM

## 2018-08-12 NOTE — Progress Notes (Signed)
Subjective:    Beverly [name] is a 42 y.o.married P4 (no grands yet)  female who presents for an annual exam. She reports that her periods have become "super heavy" for the last 9 months. She also has a period every 2-3 weeks.  Her TSH was normal recently. The patient is sexually active. GYN screening history: last pap: was normal. The patient wears seatbelts: yes. The patient participates in regular exercise: no. Has the patient ever been transfused or tattooed?: yes. The patient reports that there is not domestic violence in her life.   Menstrual History: OB History    Gravida  6   Para  4   Term  4   Preterm      AB  2   Living  4     SAB      TAB  2   Ectopic      Multiple      Live Births              Menarche age: 56 Patient's last menstrual period was 07/22/2018.    The following portions of the patient's history were reviewed and updated as appropriate: allergies, current medications, past family history, past medical history, past social history, past surgical history and problem list.  Review of Systems Pertinent items are noted in HPI.   FH- + breast cancer in both grandmothers (deceased), +maternal aunt and cousin with cervical cancer, no colon cancer Husband s/p vasectomy Homemaker Had a mammogram last week Declines flu vaccine due to egg allergy   Objective:    BP 121/78   Pulse 76   Resp 16   Ht 5\' 6"  (1.676 m)   Wt 219 lb (99.3 kg)   LMP 07/22/2018   BMI 35.35 kg/m   General Appearance:    Alert, cooperative, no distress, appears stated age  Head:    Normocephalic, without obvious abnormality, atraumatic  Eyes:    PERRL, conjunctiva/corneas clear, EOM's intact, fundi    benign, both eyes  Ears:    Normal TM's and external ear canals, both ears  Nose:   Nares normal, septum midline, mucosa normal, no drainage    or sinus tenderness  Throat:   Lips, mucosa, and tongue normal; teeth and gums normal  Neck:   Supple, symmetrical, trachea  midline, no adenopathy;    thyroid:  no enlargement/tenderness/nodules; no carotid   bruit or JVD  Back:     Symmetric, no curvature, ROM normal, no CVA tenderness  Lungs:     Clear to auscultation bilaterally, respirations unlabored  Chest Wall:    No tenderness or deformity   Heart:    Regular rate and rhythm, S1 and S2 normal, no murmur, rub   or gallop  Breast Exam:    No tenderness, masses, or nipple abnormality  Abdomen:     Soft, non-tender, bowel sounds active all four quadrants,    no masses, no organomegaly  Genitalia:    Normal female without lesion, discharge or tenderness, normal size and shape, anteverted, mobile, non-tender, normal adnexal exam      Extremities:   Extremities normal, atraumatic, no cyanosis or edema  Pulses:   2+ and symmetric all extremities  Skin:   Skin color, texture, turgor normal, no rashes or lesions  Lymph nodes:   Cervical, supraclavicular, and axillary nodes normal  Neurologic:   CNII-XII intact, normal strength, sensation and reflexes    throughout  .    Assessment:    Healthy female exam.   +  FH of breast cancer Menometrorrhagia  Plan:     Thin prep Pap smear. with cotesting Rec [facility] testing Check CBC, gyn u/s

## 2018-08-13 LAB — CBC
HCT: 43 % (ref 35.0–45.0)
HEMOGLOBIN: 14.4 g/dL (ref 11.7–15.5)
MCH: 31 pg (ref 27.0–33.0)
MCHC: 33.5 g/dL (ref 32.0–36.0)
MCV: 92.7 fL (ref 80.0–100.0)
MPV: 10.7 fL (ref 7.5–12.5)
Platelets: 327 10*3/uL (ref 140–400)
RBC: 4.64 10*6/uL (ref 3.80–5.10)
RDW: 12.4 % (ref 11.0–15.0)
WBC: 7.1 10*3/uL (ref 3.8–10.8)

## 2018-08-14 LAB — CYTOLOGY - PAP
Diagnosis: NEGATIVE
HPV (WINDOPATH): NOT DETECTED

## 2018-08-15 ENCOUNTER — Other Ambulatory Visit: Payer: Commercial Managed Care - PPO

## 2018-08-19 ENCOUNTER — Ambulatory Visit (INDEPENDENT_AMBULATORY_CARE_PROVIDER_SITE_OTHER): Payer: Commercial Managed Care - PPO

## 2018-08-19 DIAGNOSIS — D251 Intramural leiomyoma of uterus: Secondary | ICD-10-CM

## 2018-08-19 DIAGNOSIS — N83291 Other ovarian cyst, right side: Secondary | ICD-10-CM

## 2018-08-19 DIAGNOSIS — N921 Excessive and frequent menstruation with irregular cycle: Secondary | ICD-10-CM

## 2018-08-19 DIAGNOSIS — N854 Malposition of uterus: Secondary | ICD-10-CM

## 2018-10-14 ENCOUNTER — Telehealth: Payer: Self-pay | Admitting: *Deleted

## 2018-10-14 DIAGNOSIS — N83209 Unspecified ovarian cyst, unspecified side: Secondary | ICD-10-CM

## 2018-10-14 NOTE — Telephone Encounter (Signed)
Pt due for a f/u U/S so order was placed for [facility]

## 2018-10-16 ENCOUNTER — Ambulatory Visit (INDEPENDENT_AMBULATORY_CARE_PROVIDER_SITE_OTHER): Payer: Commercial Managed Care - PPO

## 2018-10-16 DIAGNOSIS — D259 Leiomyoma of uterus, unspecified: Secondary | ICD-10-CM | POA: Diagnosis not present

## 2018-10-16 DIAGNOSIS — N83209 Unspecified ovarian cyst, unspecified side: Secondary | ICD-10-CM

## 2018-10-16 DIAGNOSIS — N854 Malposition of uterus: Secondary | ICD-10-CM

## 2018-11-05 ENCOUNTER — Other Ambulatory Visit: Payer: Self-pay

## 2018-11-05 ENCOUNTER — Emergency Department (INDEPENDENT_AMBULATORY_CARE_PROVIDER_SITE_OTHER)
Admission: EM | Admit: 2018-11-05 | Discharge: 2018-11-05 | Disposition: A | Discharge status: Home / Self Care | Payer: Commercial Managed Care - PPO | Source: Home / Self Care | Attending: Family Medicine | Admitting: Family Medicine

## 2018-11-05 ENCOUNTER — Encounter: Payer: Self-pay | Admitting: Emergency Medicine

## 2018-11-05 DIAGNOSIS — J111 Influenza due to unidentified influenza virus with other respiratory manifestations: Secondary | ICD-10-CM

## 2018-11-05 DIAGNOSIS — J3089 Other allergic rhinitis: Secondary | ICD-10-CM

## 2018-11-05 DIAGNOSIS — R69 Illness, unspecified: Principal | ICD-10-CM

## 2018-11-05 MED ORDER — DOXYCYCLINE HYCLATE 100 MG PO CAPS: 100.0000 mg | ORAL_CAPSULE | Freq: Two times a day (BID) | ORAL | 0 refills | Status: DC

## 2018-11-05 MED ORDER — PREDNISONE 20 MG PO TABS: ORAL_TABLET | ORAL | 0 refills | Status: DC

## 2018-11-05 MED ORDER — BENZONATATE 200 MG PO CAPS: ORAL_CAPSULE | ORAL | 0 refills | Status: DC

## 2018-11-05 NOTE — Discharge Instructions (Addendum)
Take plain guaifenesin (1200mg  extended release tabs such as Mucinex) twice daily, with plenty of water, for cough and congestion.  Continue Pseudoephedrine (30mg , one or two every 4 to 6 hours) for sinus congestion.  Get adequate rest.   May use Afrin nasal spray (or generic oxymetazoline) each morning for about 5 days and then discontinue.  Also recommend using saline nasal spray several times daily and saline nasal irrigation (AYR is a common brand).  Use Flonase nasal spray each morning after using Afrin nasal spray and saline nasal irrigation. Try warm salt water gargles for sore throat.  Stop all antihistamines (Xyzal, etc) for now, and other non-prescription cough/cold preparations. May take Tylenol as needed for pain, fever, etc.

## 2018-11-05 NOTE — ED Triage Notes (Signed)
Scratchy throat, ears itchy, eyes crusty this morning, chills x 4 days. Was exposed to flu, did not have flu shot, allergic to eggs.

## 2018-11-05 NOTE — ED Provider Notes (Signed)
[name]    CSN: [id number] Arrival date & time: 11/05/18  0813     History   Chief Complaint Chief Complaint  Patient presents with  . URI    HPI Beverly [name] is a 42 y.o. female.   Complains of 4 day history flu-like illness including myalgias, headache, chills, fatigue, and cough.  Also has mild nasal congestion and sore throat.  Cough is non-productive and somewhat worse at night.  No pleuritic pain or shortness of breath.  She has not had a flu shot this season. Her father has the flu. She has had pneumonia in the past.  The history is provided by the patient.    Past Medical History:  Diagnosis Date  . Diabetes mellitus   . PCOS (polycystic ovarian syndrome)   . Thyroid disease    hashimoto's    Patient Active Problem List   Diagnosis Date Noted  . PCOS (polycystic ovarian syndrome) 07/23/2017  . Relies on partner vasectomy for contraception 07/23/2017  . Prediabetes 07/23/2017  . History of gestational diabetes 07/23/2017  . Obesity (BMI 30-39.9) 06/24/2014  . Hypothyroidism 07/09/2010    Past Surgical History:  Procedure Laterality Date  . BREAST BIOPSY    . WISDOM TOOTH EXTRACTION      OB History    Gravida  6   Para  4   Term  4   Preterm      AB  2   Living  4     SAB      TAB  2   Ectopic      Multiple      Live Births               Home Medications    Prior to Admission medications   Medication Sig Start Date End Date Taking? Authorizing Provider  guaiFENesin (MUCINEX) 600 MG 12 hr tablet Take by mouth 2 (two) times daily.   Yes [provider]  pseudoephedrine (SUDAFED) 30 MG tablet Take 30 mg by mouth every 4 (four) hours as needed for congestion.   Yes [provider]  ARMOUR THYROID 90 MG tablet Take 90 mg by mouth every morning. 06/30/18   [provider]  benzonatate (TESSALON) 200 MG capsule Take one cap by mouth at bedtime as needed for cough.  May repeat in 4 to 6  hours 11/05/18   Kandra Nicolas, MD  doxycycline (VIBRAMYCIN) 100 MG capsule Take 1 capsule (100 mg total) by mouth 2 (two) times daily. Take with food. 11/05/18   Kandra Nicolas, MD  loratadine (CLARITIN) 10 MG tablet Take 10 mg by mouth daily.    [provider]  predniSONE (DELTASONE) 20 MG tablet Take one tab by mouth twice daily for 4 days, then one daily for 3 days. Take with food. 11/05/18   Kandra Nicolas, MD  VITAMIN D, CHOLECALCIFEROL, PO Take by mouth.    [provider]    Family History Family History  Problem Relation Age of Onset  . Thyroid disease Mother   . Cancer Paternal Grandmother        breast  . Cancer Maternal Grandmother        breast CA  . Thyroid disease Maternal Grandmother   . Diabetes Maternal Grandfather     Social History Social History   Tobacco Use  . Smoking status: Never Smoker  . Smokeless tobacco: Never Used  Substance Use Topics  . Alcohol use: Yes  Comment: social  . Drug use: No     Allergies   Eggs or egg-derived products and Metformin and related   Review of Systems Review of Systems + sore throat + cough No pleuritic pain No wheezing + nasal congestion + post-nasal drainage No sinus pain/pressure No itchy/red eyes No earache No hemoptysis No SOB No fever, + chills No nausea No vomiting No abdominal pain No diarrhea No urinary symptoms No skin rash + fatigue + myalgias + headache Used OTC meds without relief   Physical Exam Triage Vital Signs ED Triage Vitals  Enc Vitals Group     BP 11/05/18 0846 125/88     Pulse Rate 11/05/18 0846 (!) 107     Resp --      Temp 11/05/18 0846 98.3 F (36.8 C)     Temp Source 11/05/18 0846 Oral     SpO2 11/05/18 0846 97 %     Weight 11/05/18 0847 220 lb (99.8 kg)     Height 11/05/18 0847 5\' 6"  (1.676 m)     Head Circumference --      Peak Flow --      Pain Score 11/05/18 0846 3     Pain Loc --      Pain Edu? --      Excl. in [place]? --     No data found.  Updated Vital Signs BP 125/88 (BP Location: Right Arm)   Pulse (!) 107   Temp 98.3 F (36.8 C) (Oral)   Ht 5\' 6"  (1.676 m)   Wt 99.8 kg   SpO2 97%   BMI 35.51 kg/m   Visual Acuity Right Eye Distance:   Left Eye Distance:   Bilateral Distance:    Right Eye Near:   Left Eye Near:    Bilateral Near:     Physical Exam Nursing notes and Vital Signs reviewed. Appearance:  Patient appears stated age, and in no acute distress Eyes:  Pupils are equal, round, and reactive to light and accomodation.  Extraocular movement is intact.  Conjunctivae are not inflamed  Ears:  Canals normal.  Tympanic membranes normal.  Nose:  Mildly congested turbinates.  No sinus tenderness.   Pharynx:  Normal Neck:  Supple.  Enlarged posterior/lateral nodes are palpated bilaterally, tender to palpation on the left.   Lungs:  Clear to auscultation.  Breath sounds are equal.  Moving air well. Heart:  Regular rate and rhythm without murmurs, rubs, or gallops.  Abdomen:  Nontender without masses or hepatosplenomegaly.  Bowel sounds are present.  No CVA or flank tenderness.  Extremities:  No edema.  Skin:  No rash present.    UC Treatments / Results  Labs (all labs ordered are listed, but only abnormal results are displayed) Labs Reviewed - No data to display  EKG None  Radiology No results found.  Procedures Procedures (including critical care time)  Medications Ordered in UC Medications - No data to display  Initial Impression / Assessment and Plan / UC Course  I have reviewed the triage vital signs and the nursing notes.  Pertinent labs & imaging results that were available during my care of the patient were reviewed by me and considered in my medical decision making (see chart for details).    Patient probably has influenza B, but has missed opportunity to start Tamiflu.  Will begin empiric doxycycline because of her history of sinusitis, perennial rhinitis, and past  pneumonia.  Begin prednisone burst/taper.  Prescription written for Benzonatate (Tessalon) to  take at bedtime for night-time cough.  Followup with Family Doctor if not improved in about 10 days.  Final Clinical Impressions(s) / UC Diagnoses   Final diagnoses:  Influenza-like illness  Perennial allergic rhinitis     Discharge Instructions     Take plain guaifenesin (1200mg  extended release tabs such as Mucinex) twice daily, with plenty of water, for cough and congestion.  Continue Pseudoephedrine (30mg , one or two every 4 to 6 hours) for sinus congestion.  Get adequate rest.   May use Afrin nasal spray (or generic oxymetazoline) each morning for about 5 days and then discontinue.  Also recommend using saline nasal spray several times daily and saline nasal irrigation (AYR is a common brand).  Use Flonase nasal spray each morning after using Afrin nasal spray and saline nasal irrigation. Try warm salt water gargles for sore throat.  Stop all antihistamines (Xyzal, etc) for now, and other non-prescription cough/cold preparations. May take Tylenol as needed for pain, fever, etc.     ED Prescriptions    Medication Sig Dispense Auth. Provider   doxycycline (VIBRAMYCIN) 100 MG capsule Take 1 capsule (100 mg total) by mouth 2 (two) times daily. Take with food. 20 capsule Kandra Nicolas, MD   predniSONE (DELTASONE) 20 MG tablet Take one tab by mouth twice daily for 4 days, then one daily for 3 days. Take with food. 11 tablet Kandra Nicolas, MD   benzonatate (TESSALON) 200 MG capsule Take one cap by mouth at bedtime as needed for cough.  May repeat in 4 to 6 hours 15 capsule Assunta Found Ishmael Holter, MD        Kandra Nicolas, MD 11/16/18 [phone]

## 2019-06-11 ENCOUNTER — Emergency Department (INDEPENDENT_AMBULATORY_CARE_PROVIDER_SITE_OTHER)
Admission: EM | Admit: 2019-06-11 | Discharge: 2019-06-11 | Disposition: A | Discharge status: Home / Self Care | Payer: Commercial Managed Care - PPO | Source: Home / Self Care | Attending: Emergency Medicine | Admitting: Emergency Medicine

## 2019-06-11 ENCOUNTER — Other Ambulatory Visit: Payer: Self-pay

## 2019-06-11 DIAGNOSIS — S46911A Strain of unspecified muscle, fascia and tendon at shoulder and upper arm level, right arm, initial encounter: Secondary | ICD-10-CM

## 2019-06-11 DIAGNOSIS — M7711 Lateral epicondylitis, right elbow: Secondary | ICD-10-CM

## 2019-06-11 DIAGNOSIS — S161XXA Strain of muscle, fascia and tendon at neck level, initial encounter: Secondary | ICD-10-CM

## 2019-06-11 MED ORDER — CYCLOBENZAPRINE HCL 10 MG PO TABS: 10.0000 mg | ORAL_TABLET | Freq: Every day | ORAL | 0 refills | Status: DC

## 2019-06-11 MED ORDER — PREDNISONE 20 MG PO TABS: 20.0000 mg | ORAL_TABLET | Freq: Two times a day (BID) | ORAL | 0 refills | Status: DC

## 2019-06-11 MED ORDER — MELOXICAM 7.5 MG PO TABS: ORAL_TABLET | ORAL | 0 refills | Status: DC

## 2019-06-11 NOTE — Discharge Instructions (Signed)
Your diagnosis is lateral epicondylitis, which is a tendinitis of right elbow and sometimes called tennis elbow.  You also have a cervical/neck strain and muscle strains of right shoulder and upper back.-There is no sign of bone fracture on exam, so x-rays not indicated at this time. Please read attached instruction sheets on tennis elbow, neck and muscle strain and elastic bandage/ice/elevation therapy. Prescription for Flexeril which is same muscle relaxer I prescribed in the past.  Also meloxicam, which is the same nonsteroidal anti-inflammatory prescribed in the past.-Because your symptoms are so severe, I am also prescribing prednisone twice a day for 5 days, which will hopefully hasten your improvement. You need to rest your right elbow and shoulder, using sling, Ace bandage.   If not improving in 1 week, return here or see your PCP or orthopedic/sports medicine specialist.

## 2019-06-11 NOTE — ED Triage Notes (Signed)
Has been using arm to paint, and lift, and left elbow has been very painful for about 2 weeks.

## 2019-06-12 NOTE — ED Provider Notes (Signed)
[name]    CSN: [id number] Arrival date & time: 06/11/19  1218      History   Chief Complaint Chief Complaint  Patient presents with  . Elbow Pain  Right  HPI Beverly [name] is a 43 y.o. female.   HPI For the past 2 to 3 weeks, she has been doing a lot of physical work at her home, preparing her house for sale closing very soon. In particular, she has been working on her deck, then 2 weeks ago used a 40 pound sprayer to paint the deck surface, requiring her to hold this 40 pound sprayer in right hand and use trigger to grip and spray repeatedly for a long period of time.  She is also been lifting boxes and moving things and does not recall one specific traumatic event other than the prolonged gripping of the heavy sprayer with right hand.  For 2 weeks, complains of moderate to severe sharp and dull right lateral elbow pain, worse with gripping right hand.  She feels she cannot quite fully extend right elbow.  Also, complains of severe muscle soreness right upper arm right posterior shoulder and neck and right upper back muscles, worse with all movement of right upper extremity.  No radiation.  Denies any definite numbness or weakness, except rarely feels a sensation of numbness right fourth and fifth fingers. Has not tried any particular treatment.    Past Medical History:  Diagnosis Date  . Diabetes mellitus   . PCOS (polycystic ovarian syndrome)   . Thyroid disease    hashimoto's    Patient Active Problem List   Diagnosis Date Noted  . PCOS (polycystic ovarian syndrome) 07/23/2017  . Relies on partner vasectomy for contraception 07/23/2017  . Prediabetes 07/23/2017  . History of gestational diabetes 07/23/2017  . Obesity (BMI 30-39.9) 06/24/2014  . Hypothyroidism 07/09/2010    Past Surgical History:  Procedure Laterality Date  . BREAST BIOPSY    . WISDOM TOOTH EXTRACTION      OB History    Gravida  6   Para  4   Term  4   Preterm      AB   2   Living  4     SAB      TAB  2   Ectopic      Multiple      Live Births               Home Medications    Prior to Admission medications   Medication Sig Start Date End Date Taking? Authorizing Provider  ARMOUR THYROID 90 MG tablet Take 90 mg by mouth every morning. 06/30/18   [provider]  cyclobenzaprine (FLEXERIL) 10 MG tablet Take 1 tablet (10 mg total) by mouth at bedtime. For muscle relaxant 06/11/19   Jacqulyn Cane, MD  loratadine (CLARITIN) 10 MG tablet Take 10 mg by mouth daily.    [provider]  meloxicam (MOBIC) 7.5 MG tablet Take 1 twice a day as needed for pain. Take with food. (Do not take with any other NSAID.) 06/11/19   Jacqulyn Cane, MD  predniSONE (DELTASONE) 20 MG tablet Take 1 tablet (20 mg total) by mouth 2 (two) times daily with a meal. X 5 days 06/11/19   Jacqulyn Cane, MD  progesterone (PROMETRIUM) 100 MG capsule TAKE 1 CAPSULE AT BEDTIME EVERY CYCLE ONCE A DAY 05/29/19   [provider]  pseudoephedrine (SUDAFED) 30 MG tablet Take 30 mg by mouth every  4 (four) hours as needed for congestion.    [provider]  VITAMIN D, CHOLECALCIFEROL, PO Take by mouth.    [provider]    Family History Family History  Problem Relation Age of Onset  . Thyroid disease Mother   . Cancer Paternal Grandmother        breast  . Cancer Maternal Grandmother        breast CA  . Thyroid disease Maternal Grandmother   . Diabetes Maternal Grandfather     Social History Social History   Tobacco Use  . Smoking status: Never Smoker  . Smokeless tobacco: Never Used  Substance Use Topics  . Alcohol use: Yes    Comment: social  . Drug use: No     Allergies   Eggs or egg-derived products and Metformin and related   Review of Systems Review of Systems  Constitutional: Negative for fever.  HENT: Negative.   Respiratory: Negative for cough, chest tightness and shortness of breath.   Cardiovascular: Negative for  chest pain, palpitations and leg swelling.  Gastrointestinal: Negative for nausea and vomiting.  Genitourinary: Negative for difficulty urinating.  Skin: Negative for rash.  Hematological: Negative for adenopathy.  Psychiatric/Behavioral: Negative for confusion.  All other systems reviewed and are negative.  Pertinent items noted in HPI and remainder of comprehensive ROS otherwise negative.   Physical Exam Triage Vital Signs ED Triage Vitals  Enc Vitals Group     BP 06/11/19 1321 105/72     Pulse Rate 06/11/19 1316 77     Resp 06/11/19 1316 20     Temp 06/11/19 1316 98.5 F (36.9 C)     Temp Source 06/11/19 1316 Oral     SpO2 06/11/19 1316 97 %     Weight 06/11/19 1317 207 lb (93.9 kg)     Height 06/11/19 1317 5\' 6"  (1.676 m)     Head Circumference --      Peak Flow --      Pain Score 06/11/19 1317 6     Pain Loc --      Pain Edu? --      Excl. in [place]? --    No data found.  Updated Vital Signs BP 105/72   Pulse 77   Temp 98.5 F (36.9 C) (Oral)   Resp 20   Ht 5\' 6"  (1.676 m)   Wt 93.9 kg   SpO2 97%   BMI 33.41 kg/m   Visual Acuity Right Eye Distance:   Left Eye Distance:   Bilateral Distance:    Right Eye Near:   Left Eye Near:    Bilateral Near:     Physical Exam Vitals signs reviewed.  Constitutional:      General: She is not in acute distress.    Appearance: She is well-developed.  HENT:     Head: Normocephalic and atraumatic.  Eyes:     General: No scleral icterus.    Pupils: Pupils are equal, round, and reactive to light.  Neck:     Musculoskeletal: Normal range of motion and neck supple.  Cardiovascular:     Rate and Rhythm: Normal rate and regular rhythm.  Pulmonary:     Effort: Pulmonary effort is normal.  Abdominal:     General: There is no distension.  Musculoskeletal:     Right elbow: She exhibits decreased range of motion (Mildly decreased range of motion to extension by 10 degrees.). She exhibits no swelling, no effusion, no  deformity and no laceration.  Tenderness found. Lateral epicondyle tenderness noted. No radial head, no medial epicondyle and no olecranon process tenderness noted.     Comments: There is severe tenderness right lateral epicondyle, exacerbated by handgrip or extension of wrist.  Right upper arm exam shows tenderness diffusely of muscles but no bony tenderness.  Right shoulder: No bony tenderness.  Range of motion within normal limits.  Diffuse tenderness over trapezius and deltoid muscles exacerbated by abduction, but range of motion within normal limits.  Neck: Tender with spasm right posterior cervical muscles.  No C-spine or thoracic spine or lumbar spine tenderness or deformity.  Skin:    General: Skin is warm and dry.     Capillary Refill: Capillary refill takes less than 2 seconds.     Comments: No rash  Neurological:     Mental Status: She is alert and oriented to person, place, and time.     Cranial Nerves: Cranial nerves are intact. No cranial nerve deficit.     Sensory: Sensation is intact.     Motor: Motor function is intact. No weakness or atrophy.     Gait: Gait is intact.  Psychiatric:        Behavior: Behavior normal.      UC Treatments / Results  Labs (all labs ordered are listed, but only abnormal results are displayed) Labs Reviewed - No data to display  EKG   Radiology No results found.  Procedures Procedures (including critical care time)  Medications Ordered in UC Medications - No data to display  Initial Impression / Assessment and Plan / UC Course  I have reviewed the triage vital signs and the nursing notes.  Pertinent labs & imaging results that were available during my care of the patient were reviewed by me and considered in my medical decision making (see chart for details).     Explained to patient that diagnosis is right lateral epicondylitis ("tennis elbow"). She might be having symptoms of intermittent mild right ulnar nerve  irritation, although not having the symptoms currently and sensation right fourth and fifth fingers and hand is within normal limits on exam today. Also muscle spasm and strain of the right upper arm, right shoulder muscles, right thoracic muscles and right posterior neck muscles but there is no bony or spinal tenderness. I explained and advised x-ray right elbow, but she declined. She prefers conservative treatment with relative rest, ice, elevation, compression of right elbow with Ace bandage.  Right sling provided today.  Start heat tomorrow.  She requested aggressive treatment with medication acutely and after risks benefits alternatives, prescribed Flexeril for muscle relaxant.  Short oral steroid burst.  And meloxicam as needed pain. Risk benefits alternatives discussed and she agrees with treatment plan. Follow-up with your sports medicine in 5-7 days if not improving, or sooner if symptoms become worse. Precautions discussed. Red flags discussed. Questions invited and answered. Patient voiced understanding and agreement.     Final Clinical Impressions(s) / UC Diagnoses   Final diagnoses:  Lateral epicondylitis of right elbow  Right shoulder strain, initial encounter  Neck muscle strain, initial encounter     Discharge Instructions     Your diagnosis is lateral epicondylitis, which is a tendinitis of right elbow and sometimes called tennis elbow.  You also have a cervical/neck strain and muscle strains of right shoulder and upper back.-There is no sign of bone fracture on exam, so x-rays not indicated at this time. Please read attached instruction sheets on tennis elbow, neck and muscle strain  and elastic bandage/ice/elevation therapy. Prescription for Flexeril which is same muscle relaxer I prescribed in the past.  Also meloxicam, which is the same nonsteroidal anti-inflammatory prescribed in the past.-Because your symptoms are so severe, I am also prescribing prednisone twice a  day for 5 days, which will hopefully hasten your improvement. You need to rest your right elbow and shoulder, using sling, Ace bandage.   If not improving in 1 week, return here or see your PCP or orthopedic/sports medicine specialist.   ED Prescriptions    Medication Sig Dispense Auth. Provider   meloxicam (MOBIC) 7.5 MG tablet Take 1 twice a day as needed for pain. Take with food. (Do not take with any other NSAID.) 20 tablet Jacqulyn Cane, MD   cyclobenzaprine (FLEXERIL) 10 MG tablet Take 1 tablet (10 mg total) by mouth at bedtime. For muscle relaxant 10 tablet Jacqulyn Cane, MD   predniSONE (DELTASONE) 20 MG tablet Take 1 tablet (20 mg total) by mouth 2 (two) times daily with a meal. X 5 days 10 tablet Jacqulyn Cane, MD     Controlled Substance Prescriptions [place] Controlled Substance Registry consulted? Not Applicable   Jacqulyn Cane, MD 06/12/19 1740

## 2019-07-23 ENCOUNTER — Other Ambulatory Visit: Payer: Self-pay

## 2019-07-23 ENCOUNTER — Ambulatory Visit (INDEPENDENT_AMBULATORY_CARE_PROVIDER_SITE_OTHER): Payer: Commercial Managed Care - PPO | Admitting: Osteopathic Medicine

## 2019-07-23 ENCOUNTER — Encounter: Payer: Self-pay | Admitting: Osteopathic Medicine

## 2019-07-23 VITALS — BP 139/75 | HR 83 | Temp 98.0°F | Wt 200.9 lb

## 2019-07-23 DIAGNOSIS — E282 Polycystic ovarian syndrome: Secondary | ICD-10-CM

## 2019-07-23 DIAGNOSIS — E039 Hypothyroidism, unspecified: Secondary | ICD-10-CM | POA: Diagnosis not present

## 2019-07-23 DIAGNOSIS — Z0000 Encounter for general adult medical examination without abnormal findings: Secondary | ICD-10-CM

## 2019-07-23 DIAGNOSIS — R7303 Prediabetes: Secondary | ICD-10-CM

## 2019-07-23 DIAGNOSIS — Z23 Encounter for immunization: Secondary | ICD-10-CM

## 2019-07-23 DIAGNOSIS — Z8632 Personal history of gestational diabetes: Secondary | ICD-10-CM

## 2019-07-23 NOTE — Patient Instructions (Addendum)
General Preventive Care  Most recent routine screening lipids/other labs: ordered   Everyone should have blood pressure checked once per year.   Tobacco: don't!   Alcohol: responsible moderation is ok for most adults - if you have concerns about your alcohol intake, please talk to me!   Exercise: as tolerated to reduce risk of cardiovascular disease and diabetes. Strength training will also prevent osteoporosis.   Mental health: if need for mental health care (medicines, counseling, other), or concerns about moods, please let me know!   Sexual health: if ever need for STD testing, or if concerns with libido/pain problems, please let me know! If you ever need to discuss your birth control options, please let me or Dr. Hulan Fray know!   Advanced Directive: Living Will and/or Healthcare Power of Attorney recommended for all adults, regardless of age or health.  Vaccines  Flu vaccine: recommended for almost everyone, every fall.   Shingles vaccine: Shingrix recommended after age 26.  Pneumonia vaccines: Prevnar and Pneumovax recommended after age 42, or sooner if certain medical conditions.  Tetanus booster: Tdap recommended every 10 years.  Cancer screenings   Colon cancer screening: recommended for everyone at age 18  Breast cancer screening: mammogram recommended at least every other year   Cervical cancer screening: Pap as directed by OBGYN   Lung cancer screening: not needed for non-smokers  Infection screenings . HIV: recommended screening at least once age 24-65, more often as needed. . Gonorrhea/Chlamydia: screening as needed.  . Hepatitis C: recommended for anyone born 41-1967 . TB: certain at-risk patients Other . Bone Density Test: recommended for women at age 55

## 2019-07-23 NOTE — Progress Notes (Signed)
HPI: Beverly [name] is a 43 y.o. female who  has a past medical history of Diabetes mellitus, PCOS (polycystic ovarian syndrome), and Thyroid disease.  she presents to [facility] today, 07/23/19,  for chief complaint of: Annual physical     Patient here for annual physical / wellness exam.  See preventive care reviewed as below.  Recent labs reviewed in detail with the patient.   Additional concerns today include:  None!      Past medical, surgical, social and family history reviewed:  Patient Active Problem List   Diagnosis Date Noted  . PCOS (polycystic ovarian syndrome) 07/23/2017  . Relies on partner vasectomy for contraception 07/23/2017  . Prediabetes 07/23/2017  . History of gestational diabetes 07/23/2017  . Obesity (BMI 30-39.9) 06/24/2014  . Hypothyroidism 07/09/2010    Past Surgical History:  Procedure Laterality Date  . BREAST BIOPSY    . WISDOM TOOTH EXTRACTION      Social History   Tobacco Use  . Smoking status: Never Smoker  . Smokeless tobacco: Never Used  Substance Use Topics  . Alcohol use: Yes    Comment: social    Family History  Problem Relation Age of Onset  . Thyroid disease Mother   . Cancer Paternal Grandmother        breast  . Cancer Maternal Grandmother        breast CA  . Thyroid disease Maternal Grandmother   . Diabetes Maternal Grandfather      Current medication list and allergy/intolerance information reviewed:    Current Outpatient Medications  Medication Sig Dispense Refill  . ARMOUR THYROID 90 MG tablet Take 90 mg by mouth every morning.  1  . cyclobenzaprine (FLEXERIL) 10 MG tablet Take 1 tablet (10 mg total) by mouth at bedtime. For muscle relaxant 10 tablet 0  . loratadine (CLARITIN) 10 MG tablet Take 10 mg by mouth daily.    . meloxicam (MOBIC) 7.5 MG tablet Take 1 twice a day as needed for pain. Take with food. (Do not take with any other NSAID.) 20 tablet 0  . predniSONE  (DELTASONE) 20 MG tablet Take 1 tablet (20 mg total) by mouth 2 (two) times daily with a meal. X 5 days 10 tablet 0  . progesterone (PROMETRIUM) 100 MG capsule TAKE 1 CAPSULE AT BEDTIME EVERY CYCLE ONCE A DAY    . pseudoephedrine (SUDAFED) 30 MG tablet Take 30 mg by mouth every 4 (four) hours as needed for congestion.    Marland Kitchen VITAMIN D, CHOLECALCIFEROL, PO Take by mouth.     No current facility-administered medications for this visit.     Allergies  Allergen Reactions  . Eggs Or Egg-Derived Products Rash  . Metformin And Related Diarrhea      Review of Systems:  Constitutional:  No  fever, no chills, No recent illness  HEENT: No  headache, no vision change  Cardiac: No  chest pain, No  pressure  Respiratory:  No  shortness of breath. No  Cough  Gastrointestinal: No  abdominal pain, No  nausea  Musculoskeletal: No new myalgia/arthralgia  Skin: No  Rash  Endocrine: No cold intolerance,  No heat intolerance. No polyuria/polydipsia/polyphagia   Neurologic: No  weakness, No  dizzines  Psychiatric: No  concerns with depression, No  concerns with anxiety, No sleep problems, No mood problems  Exam:  BP 139/75 (BP Location: Left Arm, Patient Position: Sitting, Cuff Size: Normal)   Pulse 83   Temp 98 F (36.7  C) (Oral)   Wt 200 lb 14.4 oz (91.1 kg)   BMI 32.43 kg/m   Constitutional: VS see above. General Appearance: alert, well-developed, well-nourished, NAD  Eyes: Normal lids and conjunctive, non-icteric sclera  Ears, Nose, Mouth, Throat: Mask in place.TM normal bilaterally.  Neck: No masses, trachea midline. No thyroid enlargement. No tenderness/mass appreciated. No lymphadenopathy  Respiratory: Normal respiratory effort. no wheeze, no rhonchi, no rales  Cardiovascular: S1/S2 normal, no murmur, no rub/gallop auscultated. RRR. No lower extremity edema.   Gastrointestinal: Nontender, no masses. No hepatomegaly, no splenomegaly. No hernia appreciated. Bowel sounds normal.  Rectal exam deferred.   Musculoskeletal: Gait normal. No clubbing/cyanosis of digits.   Neurological: Normal balance/coordination. No tremor. No cranial nerve deficit on limited exam. Motor and sensation intact and symmetric. Cerebellar reflexes intact.   Skin: warm, dry, intact. No rash/ulcer. No concerning nevi or subq nodules on limited exam.    Psychiatric: Normal judgment/insight. Normal mood and affect. Oriented x3.    No results found for this or any previous visit (from the past 72 hour(s)).  No results found.  Patient brought in lab records from biometric screening 04/2019: A1c was 5.3 Total cholesterol 165, HDL 57, LDL 95 No concerns on metabolic panel, normal kidney function and creatinine 0.75 TSH 10.7 TSH on different report from 06/10/2019 was 0.15 with elevated free T3 at 5.6 and free T4 at 1.0    ASSESSMENT/PLAN: The primary encounter diagnosis was Annual physical exam. Diagnoses of Hypothyroidism, unspecified type, Prediabetes, PCOS (polycystic ovarian syndrome), History of gestational diabetes, and Need for Tdap vaccination were also pertinent to this visit.   Thyroid / hormones are being managed by [facility] clinic in [place]   Orders Placed This Encounter  Procedures  . Tdap vaccine greater than or equal to 7yo IM    No orders of the defined types were placed in this encounter.   Patient Instructions  General Preventive Care  Most recent routine screening lipids/other labs: ordered   Everyone should have blood pressure checked once per year.   Tobacco: don't!   Alcohol: responsible moderation is ok for most adults - if you have concerns about your alcohol intake, please talk to me!   Exercise: as tolerated to reduce risk of cardiovascular disease and diabetes. Strength training will also prevent osteoporosis.   Mental health: if need for mental health care (medicines, counseling, other), or concerns about moods, please let me know!   Sexual health: if  ever need for STD testing, or if concerns with libido/pain problems, please let me know! If you ever need to discuss your birth control options, please let me or Dr. Hulan Fray know!   Advanced Directive: Living Will and/or Healthcare Power of Attorney recommended for all adults, regardless of age or health.  Vaccines  Flu vaccine: recommended for almost everyone, every fall.   Shingles vaccine: Shingrix recommended after age 73.  Pneumonia vaccines: Prevnar and Pneumovax recommended after age 65, or sooner if certain medical conditions.  Tetanus booster: Tdap recommended every 10 years.  Cancer screenings   Colon cancer screening: recommended for everyone at age 39  Breast cancer screening: mammogram recommended at least every other year   Cervical cancer screening: Pap as directed by OBGYN   Lung cancer screening: not needed for non-smokers  Infection screenings . HIV: recommended screening at least once age 72-65, more often as needed. . Gonorrhea/Chlamydia: screening as needed.  . Hepatitis C: recommended for anyone born 35-1967 . TB: certain at-risk patients Other .  Bone Density Test: recommended for women at age 68        Visit summary with medication list and pertinent instructions was printed for patient to review. All questions at time of visit were answered - patient instructed to contact office with any additional concerns or updates. ER/RTC precautions were reviewed with the patient.     Please note: voice recognition software was used to produce this document, and typos may escape review. Please contact Dr. Sheppard Coil for any needed clarifications.     Follow-up plan: Return in about 1 year (around 07/22/2020) for [place] (call week prior to visit for lab orders).

## 2019-08-04 ENCOUNTER — Encounter: Payer: Self-pay | Admitting: Sports Medicine

## 2019-08-04 ENCOUNTER — Ambulatory Visit (INDEPENDENT_AMBULATORY_CARE_PROVIDER_SITE_OTHER): Payer: Commercial Managed Care - PPO | Admitting: Sports Medicine

## 2019-08-04 ENCOUNTER — Other Ambulatory Visit: Payer: Self-pay

## 2019-08-04 ENCOUNTER — Ambulatory Visit (INDEPENDENT_AMBULATORY_CARE_PROVIDER_SITE_OTHER): Payer: Commercial Managed Care - PPO

## 2019-08-04 DIAGNOSIS — G8929 Other chronic pain: Secondary | ICD-10-CM

## 2019-08-04 DIAGNOSIS — M25561 Pain in right knee: Secondary | ICD-10-CM | POA: Diagnosis not present

## 2019-08-04 DIAGNOSIS — M5412 Radiculopathy, cervical region: Secondary | ICD-10-CM

## 2019-08-04 DIAGNOSIS — M5416 Radiculopathy, lumbar region: Secondary | ICD-10-CM

## 2019-08-04 MED ORDER — PREDNISONE 50 MG PO TABS: ORAL_TABLET | ORAL | 0 refills | Status: DC

## 2019-08-04 NOTE — Assessment & Plan Note (Signed)
Right C8 radicular distribution, present for years, failed conservative measures. X-ray, MRI, prednisone, formal PT.

## 2019-08-04 NOTE — Progress Notes (Signed)
Subjective:    CC: Multiple issues  HPI: This is a pleasant 43 year old female, she has been complaining of several months of pain, neck radiating down the right arm to the fourth and fifth fingers.  Moderate, persistent.  She also has pain in her back with radiation down the right leg to the outer 2 toes, worse with sitting, flexion, Valsalva, no bowel or bladder dysfunction, saddle numbness, no constitutional symptoms.  She also has pain that she notes in her right knee, she has pain at the joint lines, with mechanical symptoms such as buckling and catching.  I reviewed the past medical history, family history, social history, surgical history, and allergies today and no changes were needed.  Please see the problem list section below in epic for further details.  Past Medical History: Past Medical History:  Diagnosis Date  . Diabetes mellitus   . PCOS (polycystic ovarian syndrome)   . Thyroid disease    hashimoto's   Past Surgical History: Past Surgical History:  Procedure Laterality Date  . BREAST BIOPSY    . WISDOM TOOTH EXTRACTION     Social History: Social History   Socioeconomic History  . Marital status: Married    Spouse name: Not on file  . Number of children: Not on file  . Years of education: Not on file  . Highest education level: Not on file  Occupational History  . Occupation: homemaker  Social Needs  . Financial resource strain: Not on file  . Food insecurity    Worry: Not on file    Inability: Not on file  . Transportation needs    Medical: Not on file    Non-medical: Not on file  Tobacco Use  . Smoking status: Never Smoker  . Smokeless tobacco: Never Used  Substance and Sexual Activity  . Alcohol use: Yes    Comment: social  . Drug use: No  . Sexual activity: Yes    Partners: Male    Birth control/protection: None    Comment: vasectomy  Lifestyle  . Physical activity    Days per week: Not on file    Minutes per session: Not on file  .  Stress: Not on file  Relationships  . Social Herbalist on phone: Not on file    Gets together: Not on file    Attends religious service: Not on file    Active member of club or organization: Not on file    Attends meetings of clubs or organizations: Not on file    Relationship status: Not on file  Other Topics Concern  . Not on file  Social History Narrative  . Not on file   Family History: Family History  Problem Relation Age of Onset  . Thyroid disease Mother   . Cancer Paternal Grandmother        breast  . Cancer Maternal Grandmother        breast CA  . Thyroid disease Maternal Grandmother   . Diabetes Maternal Grandfather    Allergies: Allergies  Allergen Reactions  . Eggs Or Egg-Derived Products Rash  . Metformin And Related Diarrhea   Medications: See med rec.  Review of Systems: No fevers, chills, night sweats, weight loss, chest pain, or shortness of breath.   Objective:    General: Well Developed, well nourished, and in no acute distress.  Neuro: Alert and oriented x3, extra-ocular muscles intact, sensation grossly intact.  HEENT: Normocephalic, atraumatic, pupils equal round reactive to light, neck supple,  no masses, no lymphadenopathy, thyroid nonpalpable.  Skin: Warm and dry, no rashes. Cardiac: Regular rate and rhythm, no murmurs rubs or gallops, no lower extremity edema.  Respiratory: Clear to auscultation bilaterally. Not using accessory muscles, speaking in full sentences. Neck: Negative spurling's Full neck range of motion Grip strength and sensation normal in bilateral hands Strength good C4 to T1 distribution No sensory change to C4 to T1 Reflexes normal Back Exam:  Inspection: Unremarkable  Motion: Flexion 45 deg, Extension 45 deg, Side Bending to 45 deg bilaterally,  Rotation to 45 deg bilaterally  SLR laying: Negative  XSLR laying: Negative  Palpable tenderness: None. FABER: negative. Sensory change: Gross sensation intact to  all lumbar and sacral dermatomes.  Reflexes: 2+ at both patellar tendons, 2+ at achilles tendons, Babinski's downgoing.  Strength at foot  Plantar-flexion: 5/5 Dorsi-flexion: 5/5 Eversion: 5/5 Inversion: 5/5  Leg strength  Quad: 5/5 Hamstring: 5/5 Hip flexor: 5/5 Hip abductors: 5/5  Gait unremarkable. Right knee: Normal to inspection with no erythema or effusion or obvious bony abnormalities. Tender to palpation at the joint lines ROM normal in flexion and extension and lower leg rotation. Ligaments with solid consistent endpoints including ACL, PCL, LCL, MCL. Negative Mcmurray's and provocative meniscal tests. Non painful patellar compression. Patellar and quadriceps tendons unremarkable. Hamstring and quadriceps strength is normal.  Impression and Recommendations:    Radiculitis of right cervical region Right C8 radicular distribution, present for years, failed conservative measures. X-ray, MRI, prednisone, formal PT.  Right lumbar radiculitis Right S1 distribution present for years, failed conservative measures. X-ray, MRI, prednisone, formal PT.  Chronic pain of right knee With mechanical symptoms, buckling, locking. X-rays, MRI.   ___________________________________________ Gwen Her. Dianah Field, M.D., ABFM., CAQSM. Primary Care and Sports Medicine [facility]  Adjunct Professor of [employer] [facility] [facility] Medicine

## 2019-08-04 NOTE — Assessment & Plan Note (Signed)
With mechanical symptoms, buckling, locking. X-rays, MRI.

## 2019-08-04 NOTE — Assessment & Plan Note (Signed)
Right S1 distribution present for years, failed conservative measures. X-ray, MRI, prednisone, formal PT.

## 2019-08-05 ENCOUNTER — Encounter: Payer: Self-pay | Admitting: Sports Medicine

## 2019-08-08 ENCOUNTER — Other Ambulatory Visit: Payer: Self-pay | Admitting: Obstetrics & Gynecology

## 2019-08-08 DIAGNOSIS — Z1231 Encounter for screening mammogram for malignant neoplasm of breast: Secondary | ICD-10-CM

## 2019-08-13 ENCOUNTER — Other Ambulatory Visit: Payer: Self-pay

## 2019-08-13 ENCOUNTER — Encounter: Payer: Self-pay | Admitting: Physical Therapy

## 2019-08-13 ENCOUNTER — Ambulatory Visit (INDEPENDENT_AMBULATORY_CARE_PROVIDER_SITE_OTHER): Payer: Commercial Managed Care - PPO | Admitting: Physical Therapy

## 2019-08-13 DIAGNOSIS — M542 Cervicalgia: Secondary | ICD-10-CM

## 2019-08-13 DIAGNOSIS — M5416 Radiculopathy, lumbar region: Secondary | ICD-10-CM
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
## 2019-08-14 ENCOUNTER — Encounter: Payer: Self-pay | Admitting: Sports Medicine

## 2019-08-25 ENCOUNTER — Telehealth: Payer: Self-pay

## 2019-08-25 NOTE — Telephone Encounter (Signed)
Reemas states she has finished the physical therapy. She is ready for the MRI's.

## 2019-08-26 NOTE — Telephone Encounter (Signed)
MRI's approved. Imaging notified. Left VM for Pt with status update.

## 2019-08-28 ENCOUNTER — Ambulatory Visit (INDEPENDENT_AMBULATORY_CARE_PROVIDER_SITE_OTHER): Payer: Commercial Managed Care - PPO

## 2019-08-28 ENCOUNTER — Other Ambulatory Visit: Payer: Self-pay

## 2019-08-28 DIAGNOSIS — Z1231 Encounter for screening mammogram for malignant neoplasm of breast: Secondary | ICD-10-CM | POA: Diagnosis not present

## 2019-09-01 ENCOUNTER — Ambulatory Visit (INDEPENDENT_AMBULATORY_CARE_PROVIDER_SITE_OTHER): Payer: Commercial Managed Care - PPO

## 2019-09-01 ENCOUNTER — Other Ambulatory Visit: Payer: Self-pay

## 2019-09-01 DIAGNOSIS — M25561 Pain in right knee: Secondary | ICD-10-CM

## 2019-09-01 DIAGNOSIS — M5412 Radiculopathy, cervical region: Secondary | ICD-10-CM

## 2019-09-01 DIAGNOSIS — M5416 Radiculopathy, lumbar region: Secondary | ICD-10-CM | POA: Diagnosis not present

## 2019-09-01 DIAGNOSIS — G8929 Other chronic pain: Secondary | ICD-10-CM | POA: Diagnosis not present

## 2019-09-14 IMAGING — US US TRANSVAGINAL NON-OB
2 series · 13 of 25 positions shown · non-contrast
Comparison: CT abdomen and pelvis 01/06/2013

CLINICAL DATA: Menorrhagia with irregular cycle, heavy menses for 9
months with periods every 2-3 weeks, history polycystic ovarian
syndrome, diabetes mellitus, thyroid disease

EXAM:
ULTRASOUND PELVIS TRANSVAGINAL
TECHNIQUE: Transvaginal ultrasound examination of the pelvis was performed
including evaluation of the uterus, ovaries, adnexal regions, and
pelvic cul-de-sac.

[Series 1: us transvaginal non-ob · 0.14mm/px · 12 of 67 slices shown (1 of 2)]
[im 1/67]
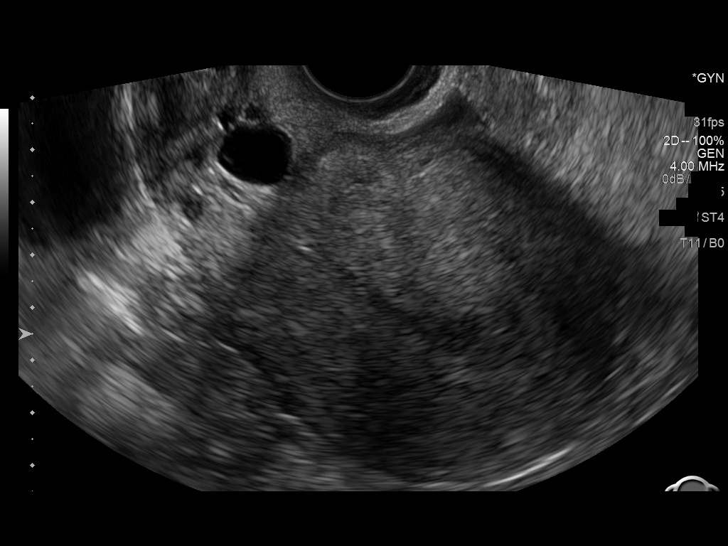
[im 6/67]
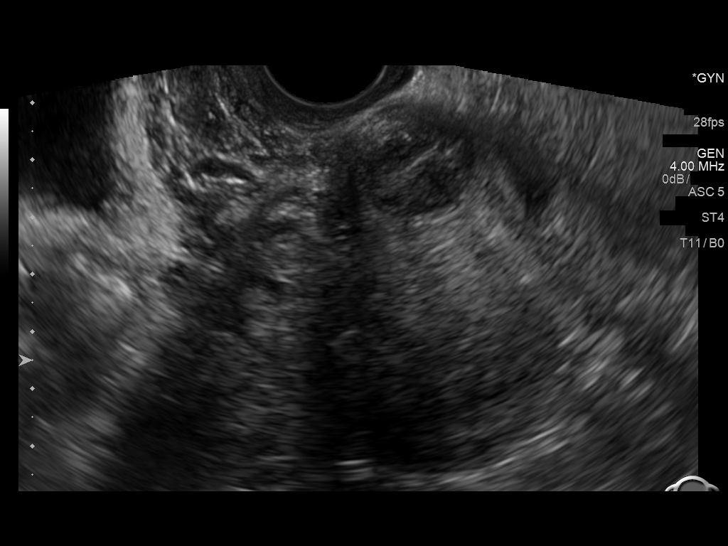
[im 12/67]
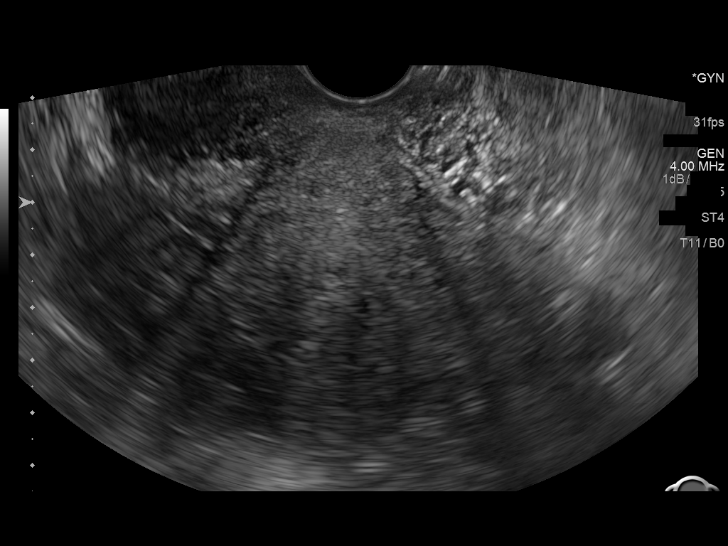
[im 18/67]
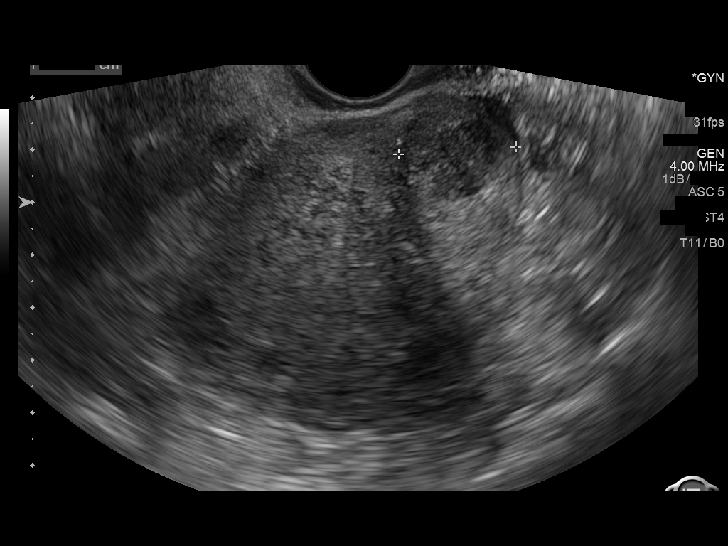
[im 23/67]
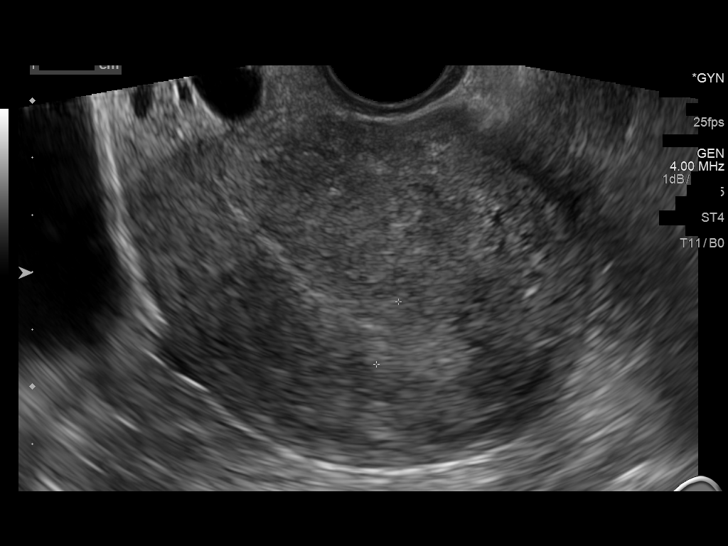
[im 29/67]
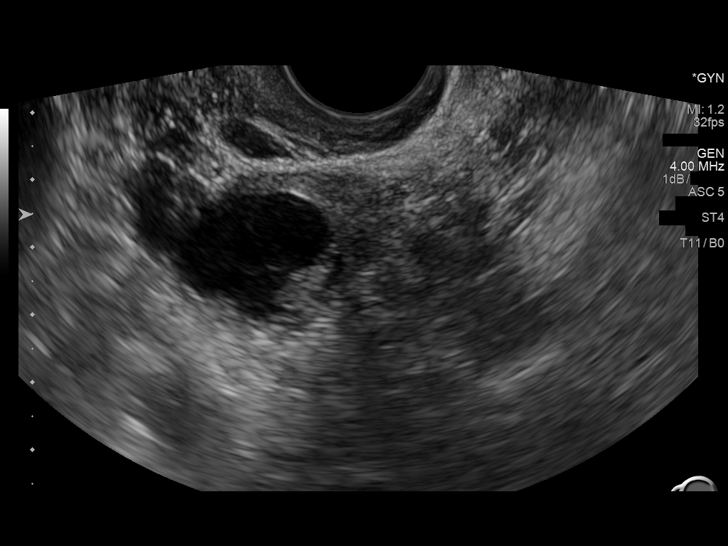
[im 35/67]
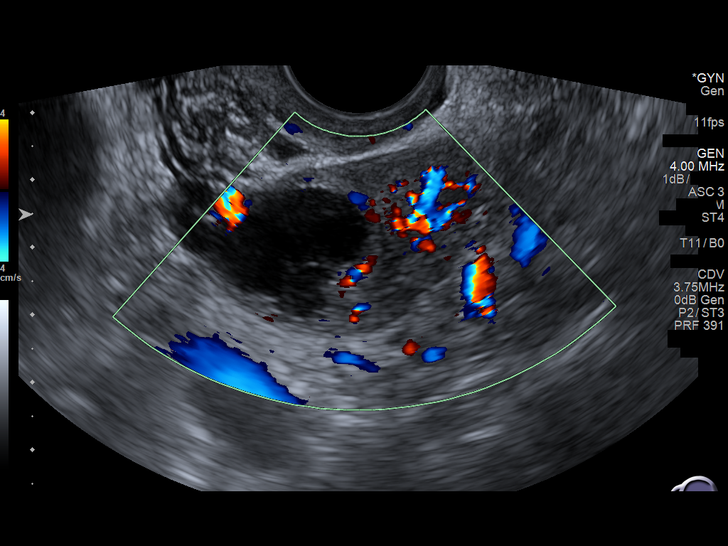
[im 41/67]
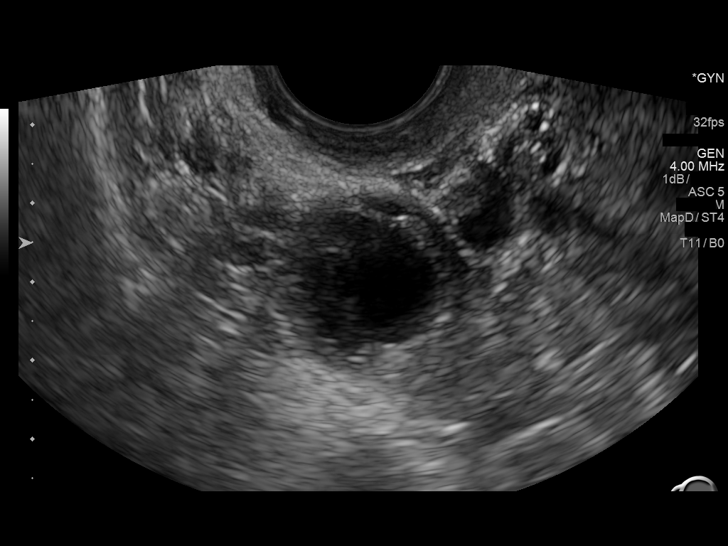
[im 46/67]
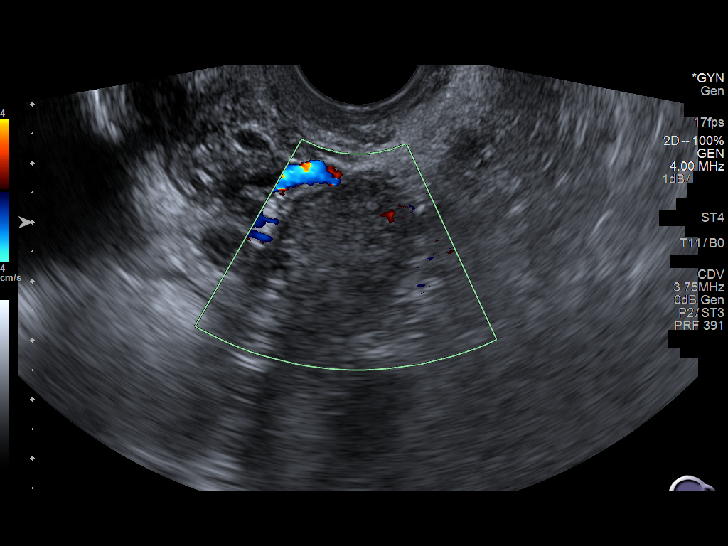
[im 52/67]
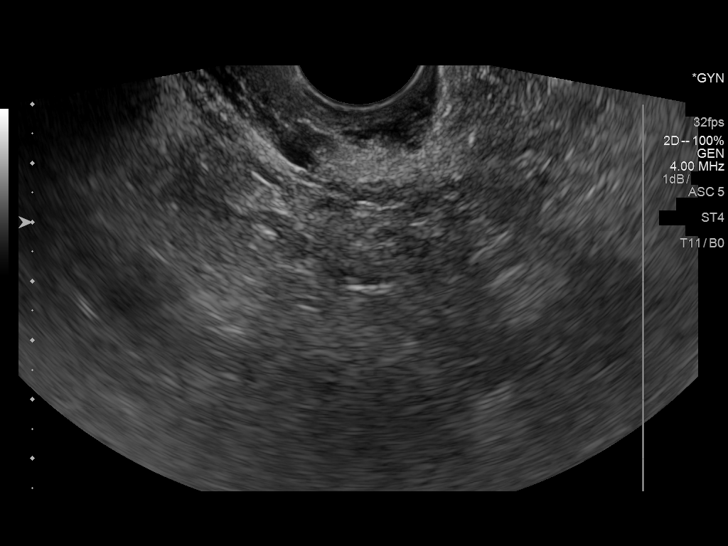
[im 58/67]
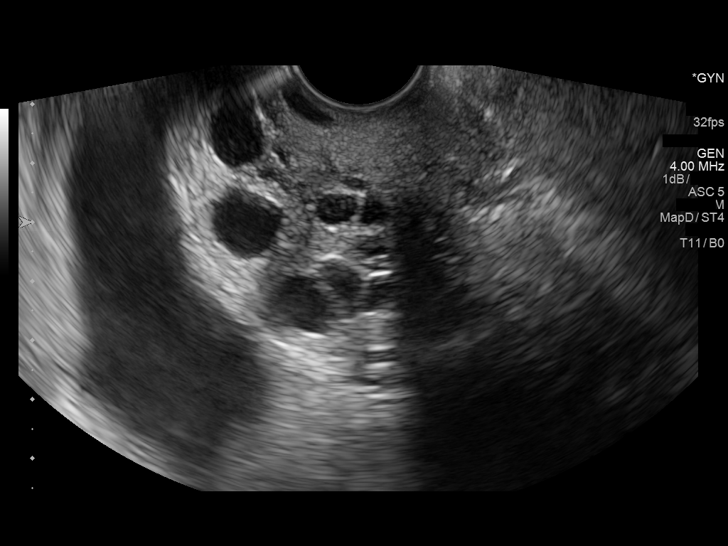
[im 64/67]
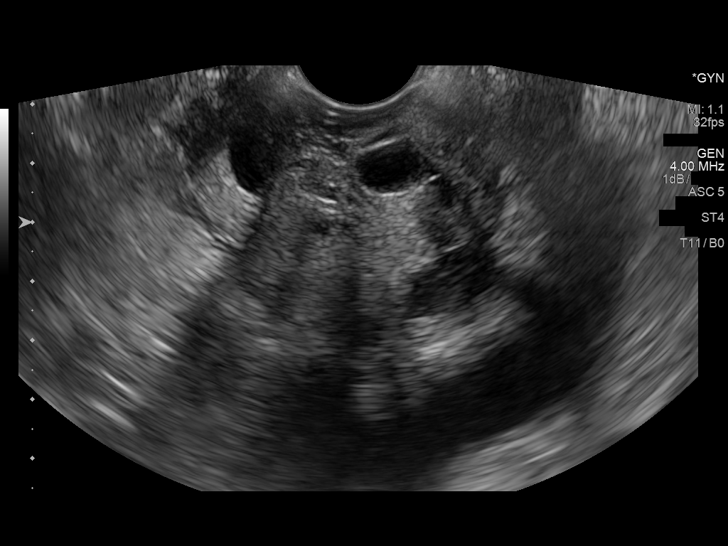

[Series 2: us transvaginal non-ob · 0.15mm/px · 1 of 2 slices shown (2 of 2)]
[im 1/2]
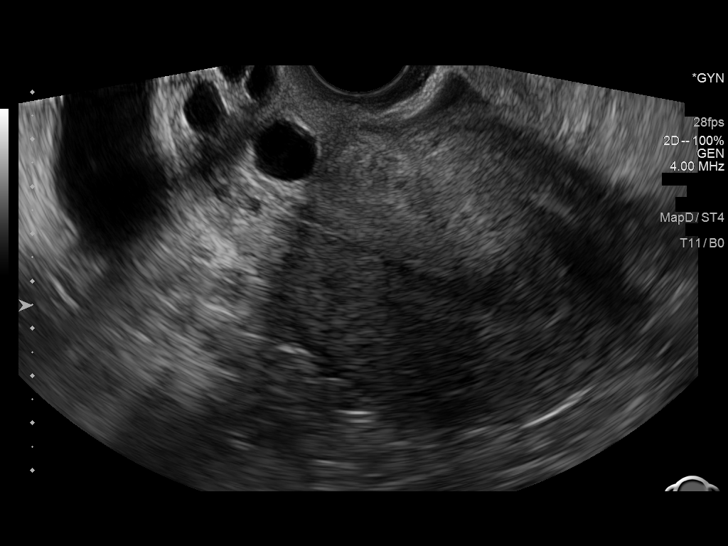

[13 of 25 positions shown; findings below may reference images not displayed]

FINDINGS: Uterus

Measurements: 11.0 x 6.0 x 7.8. Retroverted. Appears slightly
prominent size. Small posterior fundal leiomyoma on LEFT 2.3 x 2.1 x
2.2 cm, intramural.

Endometrium

Thickness: 12 mm. Endometrial margins are suboptimally defined. No
gross evidence of mass or fluid collection.

Right ovary

Measurements: 4.4 x 3.0 x 2.8 cm. Small complicated cystic lesion
question hemorrhagic cyst with scattered internal echoes and
questionable septation 2.3 x 1.9 x 1.9 cm

Left ovary

Measurements: 3.9 x 2.5 x 1.2 cm.  Normal morphology without mass

Other findings: Trace free pelvic fluid. Multiple nabothian cysts in
cervix.
IMPRESSION: Retroverted uterus with a 2.3 cm diameter LEFT fundal leiomyoma.

Small complicated question hemorrhagic cyst of RIGHT ovary,
recommendation below.

Short-interval follow up ultrasound in 6-12 weeks is recommended,
preferably during the week following the patient's normal menses, to
reassess this lesion and exclude complex cystic ovarian tumor.

## 2019-09-15 ENCOUNTER — Encounter: Payer: Self-pay | Admitting: Sports Medicine

## 2019-09-15 ENCOUNTER — Ambulatory Visit (INDEPENDENT_AMBULATORY_CARE_PROVIDER_SITE_OTHER): Payer: Commercial Managed Care - PPO | Admitting: Sports Medicine

## 2019-09-15 ENCOUNTER — Other Ambulatory Visit: Payer: Self-pay

## 2019-09-15 DIAGNOSIS — M5412 Radiculopathy, cervical region: Secondary | ICD-10-CM

## 2019-09-15 DIAGNOSIS — M25561 Pain in right knee: Secondary | ICD-10-CM | POA: Diagnosis not present

## 2019-09-15 DIAGNOSIS — G8929 Other chronic pain: Secondary | ICD-10-CM

## 2019-09-15 NOTE — Assessment & Plan Note (Signed)
MRI shows multiple internal derangements, dominant findings osteoarthritis. Right knee injection today, return in a month for this.

## 2019-09-15 NOTE — Assessment & Plan Note (Signed)
Right C8 distribution radiculitis, proceeding with right C7-T1 interlaminar epidural. I would also like her to do some more aggressive formal PT for her neck, back, knee. Return to see me 1 month after the injection.

## 2019-09-15 NOTE — Progress Notes (Signed)
Subjective:    CC: Follow-up  HPI: Beverly [name] returns, she is a pleasant 43 year old female, we have been treating her for right cervical and lumbar radiculitis as well as right knee osteoarthritis, she had several MRIs, see below for further details.  I reviewed the past medical history, family history, social history, surgical history, and allergies today and no changes were needed.  Please see the problem list section below in epic for further details.  Past Medical History: Past Medical History:  Diagnosis Date  . Diabetes mellitus   . PCOS (polycystic ovarian syndrome)   . Thyroid disease    hashimoto's   Past Surgical History: Past Surgical History:  Procedure Laterality Date  . BREAST BIOPSY    . WISDOM TOOTH EXTRACTION     Social History: Social History   Socioeconomic History  . Marital status: Married    Spouse name: Not on file  . Number of children: Not on file  . Years of education: Not on file  . Highest education level: Not on file  Occupational History  . Occupation: homemaker  Social Needs  . Financial resource strain: Not on file  . Food insecurity    Worry: Not on file    Inability: Not on file  . Transportation needs    Medical: Not on file    Non-medical: Not on file  Tobacco Use  . Smoking status: Never Smoker  . Smokeless tobacco: Never Used  Substance and Sexual Activity  . Alcohol use: Yes    Comment: social  . Drug use: No  . Sexual activity: Yes    Partners: Male    Birth control/protection: None    Comment: vasectomy  Lifestyle  . Physical activity    Days per week: Not on file    Minutes per session: Not on file  . Stress: Not on file  Relationships  . Social Herbalist on phone: Not on file    Gets together: Not on file    Attends religious service: Not on file    Active member of club or organization: Not on file    Attends meetings of clubs or organizations: Not on file    Relationship status: Not on file  Other  Topics Concern  . Not on file  Social History Narrative  . Not on file   Family History: Family History  Problem Relation Age of Onset  . Thyroid disease Mother   . Cancer Paternal Grandmother        breast  . Cancer Maternal Grandmother        breast CA  . Thyroid disease Maternal Grandmother   . Diabetes Maternal Grandfather    Allergies: Allergies  Allergen Reactions  . Eggs Or Egg-Derived Products Rash  . Metformin And Related Diarrhea   Medications: See med rec.  Review of Systems: No fevers, chills, night sweats, weight loss, chest pain, or shortness of breath.   Objective:    General: Well Developed, well nourished, and in no acute distress.  Neuro: Alert and oriented x3, extra-ocular muscles intact, sensation grossly intact.  HEENT: Normocephalic, atraumatic, pupils equal round reactive to light, neck supple, no masses, no lymphadenopathy, thyroid nonpalpable.  Skin: Warm and dry, no rashes. Cardiac: Regular rate and rhythm, no murmurs rubs or gallops, no lower extremity edema.  Respiratory: Clear to auscultation bilaterally. Not using accessory muscles, speaking in full sentences.  Procedure: Real-time Ultrasound Guided injection of the right knee Device: GE Logiq E  Verbal informed  consent obtained.  Time-out conducted.  Noted no overlying erythema, induration, or other signs of local infection.  Skin prepped in a sterile fashion.  Local anesthesia: Topical Ethyl chloride.  With sterile technique and under real time ultrasound guidance:  1 cc Kenalog 40, 2 cc lidocaine, 2 cc bupivacaine injected easily Completed without difficulty  Pain immediately resolved suggesting accurate placement of the medication.  Advised to call if fevers/chills, erythema, induration, drainage, or persistent bleeding.  Images permanently stored and available for review in the ultrasound unit.  Impression: Technically successful ultrasound guided injection.  Impression and  Recommendations:    Chronic pain of right knee MRI shows multiple internal derangements, dominant findings osteoarthritis. Right knee injection today, return in a month for this.  Radiculitis of right cervical region Right C8 distribution radiculitis, proceeding with right C7-T1 interlaminar epidural. I would also like her to do some more aggressive formal PT for her neck, back, knee. Return to see me 1 month after the injection.   ___________________________________________ Gwen Her. Dianah Field, M.D., ABFM., CAQSM. Primary Care and Sports Medicine [facility]  Adjunct Professor of [employer] [employer] [facility] [employer] Medicine

## 2019-09-17 ENCOUNTER — Other Ambulatory Visit: Payer: Self-pay

## 2019-09-17 ENCOUNTER — Ambulatory Visit
Admission: RE | Admit: 2019-09-17 | Discharge: 2019-09-17 | Disposition: A | Discharge status: Home / Self Care | Payer: Commercial Managed Care - PPO | Source: Ambulatory Visit | Attending: Sports Medicine | Admitting: Sports Medicine

## 2019-09-17 MED ORDER — TRIAMCINOLONE ACETONIDE 40 MG/ML IJ SUSP (RADIOLOGY)
60.0000 mg | Freq: Once | INTRAMUSCULAR | Status: AC
  Administered 2019-09-17: 60 mg via EPIDURAL

## 2019-09-17 MED ORDER — IOPAMIDOL (ISOVUE-M 300) INJECTION 61%
1.0000 mL | Freq: Once | INTRAMUSCULAR | Status: AC
  Administered 2019-09-17: 1 mL via EPIDURAL

## 2019-09-17 NOTE — Discharge Instructions (Signed)

## 2019-09-26 ENCOUNTER — Ambulatory Visit (INDEPENDENT_AMBULATORY_CARE_PROVIDER_SITE_OTHER): Payer: Commercial Managed Care - PPO | Admitting: Physical Therapy

## 2019-09-26 ENCOUNTER — Other Ambulatory Visit: Payer: Self-pay

## 2019-09-26 ENCOUNTER — Encounter: Payer: Self-pay | Admitting: Physical Therapy

## 2019-09-26 DIAGNOSIS — M25561 Pain in right knee: Secondary | ICD-10-CM | POA: Diagnosis not present

## 2019-09-26 DIAGNOSIS — M542 Cervicalgia: Secondary | ICD-10-CM

## 2019-09-26 DIAGNOSIS — M79601 Pain in right arm: Secondary | ICD-10-CM | POA: Diagnosis not present

## 2019-09-26 DIAGNOSIS — M5416 Radiculopathy, lumbar region: Secondary | ICD-10-CM

## 2019-09-26 DIAGNOSIS — G8929 Other chronic pain: Secondary | ICD-10-CM

## 2019-09-26 NOTE — Patient Instructions (Signed)
Access Code: [email or web address]  URL: [email or web address]  Date: 09/26/2019  Prepared by: Elsie Ra   Exercises  Seated Cervical Retraction - 10 reps - 2-3 sets - 2x daily - 6x weekly  Seated Cervical Sidebending Stretch - 3 sets - 30 sec hold - 2x daily - 6x weekly  Ulnar Nerve Flossing - 10 reps - 1 sets - 2x daily - 6x weekly  Seated Assisted Cervical Rotation with Towel - 10 reps - 1 sets - 5 hold - 2x daily - 6x weekly  Supine Lower Trunk Rotation - 10 reps - 1 sets - 5 sec hold - 2x daily - 6x weekly  Standing Lumbar Extension - 10 reps - 1-2 sets - 5 hold - 2x daily - 6x weekly  Sleep Tips    .Keep a consistent sleep schedule. Get up at the same time every day, even on weekends or during vacations. .Set a bedtime that is early enough for you to get at least 7 hours of sleep. .Don't go to bed unless you are sleepy.  .If you don't fall asleep after 20 minutes, get out of bed.  .Establish a relaxing bedtime routine.  .Use your bed only for sleep and sex.  .Make your bedroom quiet and relaxing. Keep the room at a comfortable, cool temperature.  .Limit exposure to bright light in the evenings. .Turn off electronic devices at least 30 minutes before bedtime. .Don't eat a large meal before bedtime. If you are hungry at night, eat a light, healthy snack.  .Exercise regularly and maintain a healthy diet.  Marland KitchenAvoid consuming caffeine in the late afternoon or evening.  .Avoid consuming alcohol before bedtime.  .Reduce your fluid intake before bedtime.
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
## 2019-09-30 ENCOUNTER — Encounter: Payer: Self-pay | Admitting: Physical Therapy

## 2019-09-30 ENCOUNTER — Other Ambulatory Visit: Payer: Self-pay

## 2019-09-30 ENCOUNTER — Ambulatory Visit (INDEPENDENT_AMBULATORY_CARE_PROVIDER_SITE_OTHER): Payer: Commercial Managed Care - PPO | Admitting: Physical Therapy

## 2019-09-30 DIAGNOSIS — M79601 Pain in right arm: Secondary | ICD-10-CM

## 2019-09-30 DIAGNOSIS — M5416 Radiculopathy, lumbar region: Secondary | ICD-10-CM

## 2019-09-30 DIAGNOSIS — G8929 Other chronic pain: Secondary | ICD-10-CM

## 2019-09-30 DIAGNOSIS — M25561 Pain in right knee: Secondary | ICD-10-CM

## 2019-09-30 DIAGNOSIS — M542 Cervicalgia: Secondary | ICD-10-CM

## 2019-09-30 NOTE — Therapy (Signed)
[place], [place], [address] Phone: [phone]   Fax:  [phone]  Physical Therapy Treatment  Patient Details  Name: Beverly [name] MRN: [id number] Date of Birth: 1976-10-03 Referring Provider (PT): Silverio Decamp, MD   Encounter Date: 09/30/2019  PT End of Session - 09/30/19 0804    Visit Number  2    Number of Visits  12    Date for PT Re-Evaluation  11/07/19    Authorization Type  UHC UMR    PT Start Time  0804    PT Stop Time  0848    PT Time Calculation (min)  44 min    Activity Tolerance  Patient tolerated treatment well    Behavior During Therapy  [facility] for tasks assessed/performed       Past Medical History:  Diagnosis Date  . Diabetes mellitus   . PCOS (polycystic ovarian syndrome)   . Thyroid disease    hashimoto's    Past Surgical History:  Procedure Laterality Date  . BREAST BIOPSY    . WISDOM TOOTH EXTRACTION      There were no vitals filed for this visit.  Subjective Assessment - 09/30/19 0805    Subjective  Pt reports her Rt side low back started to bother her .  She aggrivated it vacumming, the neck and everything else is the same.    Currently in Pain?  Yes    Pain Score  6     Pain Location  Arm    Pain Type  Chronic pain    Aggravating Factors   low back is 4/10 and worse with standing and walking.    Multiple Pain Sites  Yes         [facility] PT Assessment - 09/30/19 0001      Assessment   Medical Diagnosis  neck pain with Rt radiculopathy, back pain, Rt knee pain    Referring Provider (PT)  Silverio Decamp, MD                   [facility] Adult PT Treatment/Exercise - 09/30/19 0001      Self-Care   Self-Care  ADL's    ADL's  reviewed body mechanics and posture handout.       Exercises   Exercises  Lumbar      Lumbar Exercises: Supine   Other Supine Lumbar Exercises  decompression series, 10 reps each with 5 sec holds      Manual Therapy   Manual Therapy  Soft tissue mobilization;Joint mobilization;Passive ROM;Manual Traction    Joint Mobilization  grade II cervical mobs in supine    Soft tissue mobilization  STM to bilat UT, cervical paraspinals and pecs    Passive ROM  cervical side bend and stretching    Manual Traction  cervical                   PT Long Term Goals - 09/26/19 0949      PT LONG TERM GOAL #1   Title  Pt will be I and compliant with HEP and verbalize plan for continued exercise post PT discharge. (Target goal for all goals 6 weeks 11/07/19)    Status  New      PT LONG TERM GOAL #2   Title  Pt will improve neck and lumbar ROM to [facility] to improve mobility    Status  New      PT LONG TERM GOAL #3  Title  Pt will improve overall strength to at least 5-/5 in UE and 4+ in LE all grossly tested in siitting MMT to improve function    Status  New      PT LONG TERM GOAL #4   Title  Pt will reduce pain by overall 50% with improved activity tolerance with usual activity to >30 minutes    Baseline  8-9 after 10 min    Status  New            Plan - 09/30/19 0940    Clinical Impression Statement  This is Beverly [name] second visit, no change from eval last week.  She tolerated decompression series well with no increase in pain.  Had some Rt UE decrease in symptoms with manual work.  She will need to be progressed slowly due to multiple back issues.    Rehab Potential  Fair    PT Frequency  2x / week    PT Duration  6 weeks    PT Next Visit Plan  may benefit from DN    Consulted and Agree with Plan of Care  Patient       Patient will benefit from skilled therapeutic intervention in order to improve the following deficits and impairments:  Decreased activity tolerance, Decreased endurance, Decreased range of motion, Decreased strength, Difficulty walking, Increased muscle spasms, Postural dysfunction, Pain  Visit Diagnosis: Cervicalgia  Radiculopathy, lumbar region  Pain in right  arm  Chronic pain of right knee     Problem List Patient Active Problem List   Diagnosis Date Noted  . Radiculitis of right cervical region 08/04/2019  . Right lumbar radiculitis 08/04/2019  . Chronic pain of right knee 08/04/2019  . PCOS (polycystic ovarian syndrome) 07/23/2017  . Relies on partner vasectomy for contraception 07/23/2017  . Prediabetes 07/23/2017  . History of gestational diabetes 07/23/2017  . Obesity (BMI 30-39.9) 06/24/2014  . Hypothyroidism 07/09/2010    Boneta Lucks rPT  09/30/2019, 9:42 AM  [facility] [place], [place], [address] Phone: [phone]   Fax:  [phone]  Name: Beverly [name] MRN: [id number] Date of Birth: 05/16/1976

## 2019-09-30 NOTE — Patient Instructions (Signed)

## 2019-10-07 ENCOUNTER — Encounter: Payer: Self-pay | Admitting: Physical Therapy

## 2019-10-07 ENCOUNTER — Other Ambulatory Visit: Payer: Self-pay

## 2019-10-07 ENCOUNTER — Ambulatory Visit (INDEPENDENT_AMBULATORY_CARE_PROVIDER_SITE_OTHER): Payer: Commercial Managed Care - PPO | Admitting: Physical Therapy

## 2019-10-07 DIAGNOSIS — M542 Cervicalgia: Secondary | ICD-10-CM | POA: Diagnosis not present

## 2019-10-07 DIAGNOSIS — M79601 Pain in right arm: Secondary | ICD-10-CM

## 2019-10-07 DIAGNOSIS — M25561 Pain in right knee: Secondary | ICD-10-CM

## 2019-10-07 DIAGNOSIS — M5416 Radiculopathy, lumbar region: Secondary | ICD-10-CM

## 2019-10-07 DIAGNOSIS — G8929 Other chronic pain: Secondary | ICD-10-CM

## 2019-10-07 NOTE — Patient Instructions (Signed)
Access Code: [email or web address]  URL: [email or web address]  Date: 10/07/2019  Prepared by: Almyra Free Katheline Brendlinger   Exercises Seated Cervical Retraction - 10 reps - 2-3 sets - 2x daily - 6x weekly Seated Cervical Sidebending Stretch - 3 sets - 30 sec hold - 2x daily - 6x weekly Ulnar Nerve Flossing - 10 reps - 1 sets - 2x daily - 6x weekly Seated Assisted Cervical Rotation with Towel - 10 reps - 1 sets - 5 hold - 2x daily - 6x weekly Supine Lower Trunk Rotation - 10 reps - 1 sets - 5 sec hold - 2x daily - 6x weekly Standing Lumbar Extension - 10 reps - 1-2 sets - 5 hold - 2x daily - 6x weekly Prone Transversus Abdominus Contraction - 10 reps - 1 sets - 5 hold - 2x daily - 7x weekly Supine Transversus Abdominis Palpation - 10 reps - 1 sets - 5 hold - 2x daily - 7x weekly

## 2019-10-07 NOTE — Therapy (Signed)
[place], [place], [address] Phone: [phone]   Fax:  [phone]  Physical Therapy Treatment  Patient Details  Name: Beverly [name] MRN: [id number] Date of Birth: 1976-08-08 Referring Provider (PT): Silverio Decamp, MD   Encounter Date: 10/07/2019  PT End of Session - 10/07/19 0848    Visit Number  3    Number of Visits  12    Date for PT Re-Evaluation  11/07/19    PT Start Time  0848    PT Stop Time  0942    PT Time Calculation (min)  54 min    Activity Tolerance  Patient tolerated treatment well    Behavior During Therapy  [facility] for tasks assessed/performed       Past Medical History:  Diagnosis Date  . Diabetes mellitus   . PCOS (polycystic ovarian syndrome)   . Thyroid disease    hashimoto's    Past Surgical History:  Procedure Laterality Date  . BREAST BIOPSY    . WISDOM TOOTH EXTRACTION      There were no vitals filed for this visit.  Subjective Assessment - 10/07/19 0850    Subjective  Feeling in shoulder and elbow, but tingling a little better. My back is hurting and sciatica bothering me around hip into groin.    Patient Stated Goals  get the pain down, get back some strenght    Currently in Pain?  Yes    Pain Score  4     Pain Location  Arm    Pain Orientation  Right    Pain Descriptors / Indicators  Aching;Burning    Multiple Pain Sites  Yes    Pain Score  4    Pain Location  Back    Pain Orientation  Right    Pain Descriptors / Indicators  --   unstable   Aggravating Factors   getting up/down                       [facility] Adult PT Treatment/Exercise - 10/07/19 0001      Exercises   Exercises  Neck      Neck Exercises: Standing   Neck Retraction  10 reps;3 secs    Neck Retraction Limitations  on noodle    Upper Extremity Flexion with Stabilization  5 reps;Flexion    UE Flexion with Stabilization Limitations  on noodle    Other Standing  Exercises  scap retraction 10 x 3 sec hold; 5 in W position (tight in ribs).    Other Standing Exercises  rows red x 10; extension red x 10      Lumbar Exercises: Aerobic   UBE (Upper Arm Bike)  L1 x 4 in 2/2 with ab set      Lumbar Exercises: Supine   Ab Set  5 reps;5 seconds    AB Set Limitations  with TCs and VCs for correct engagement    Pelvic Tilt  5 reps;5 seconds    Clam  5 reps   bil   Clam Limitations  bil done unilaterally; reports some tightness in bil groin    Bent Knee Raise  5 reps   ea   Bent Knee Raise Limitations  poor stabilization    Bridge  5 reps;3 seconds      Lumbar Exercises: Prone   Other Prone Lumbar Exercises  pelvic press 3 sec x 10      Modalities   Modalities  Moist Heat      Moist Heat Therapy   Number Minutes Moist Heat  10 Minutes    Moist Heat Location  Cervical      Manual Therapy   Manual Therapy  Soft tissue mobilization    Soft tissue mobilization  to right UT, SCM and scalenes and levaator scapula                  PT Long Term Goals - 09/26/19 0949      PT LONG TERM GOAL #1   Title  Pt will be I and compliant with HEP and verbalize plan for continued exercise post PT discharge. (Target goal for all goals 6 weeks 11/07/19)    Status  New      PT LONG TERM GOAL #2   Title  Pt will improve neck and lumbar ROM to [facility] to improve mobility    Status  New      PT LONG TERM GOAL #3   Title  Pt will improve overall strength to at least 5-/5 in UE and 4+ in LE all grossly tested in siitting MMT to improve function    Status  New      PT LONG TERM GOAL #4   Title  Pt will reduce pain by overall 50% with improved activity tolerance with usual activity to >30 minutes    Baseline  8-9 after 10 min    Status  New            Plan - 10/07/19 1238    Clinical Impression Statement  Patient tolerated increased exercise today without c/o pain. She has definite weakness in TA and difficulty with stabilization overall. She does  have a good contraction with basic TA and mulitifdi TE. Tightness in right scalenes and SCM.    PT Treatment/Interventions  ADLs/Self Care Home Management;Aquatic Therapy;Cryotherapy;Electrical Stimulation;Iontophoresis 4mg /ml Dexamethasone;Moist Heat;Traction;Ultrasound;Stair training;Therapeutic exercise;Balance training;Neuromuscular re-education;Therapeutic activities;Patient/family education;Manual techniques;Passive range of motion;Dry needling;Energy conservation;Joint Manipulations;Spinal Manipulations;Taping;Vasopneumatic Device    PT Next Visit Plan  Try DN to cervical. Continue with TE as tolerated.    PT Home Exercise Plan  Access Code: [email or web address]       Patient will benefit from skilled therapeutic intervention in order to improve the following deficits and impairments:  Decreased activity tolerance, Decreased endurance, Decreased range of motion, Decreased strength, Difficulty walking, Increased muscle spasms, Postural dysfunction, Pain  Visit Diagnosis: Cervicalgia  Radiculopathy, lumbar region  Pain in right arm  Chronic pain of right knee     Problem List Patient Active Problem List   Diagnosis Date Noted  . Radiculitis of right cervical region 08/04/2019  . Right lumbar radiculitis 08/04/2019  . Chronic pain of right knee 08/04/2019  . PCOS (polycystic ovarian syndrome) 07/23/2017  . Relies on partner vasectomy for contraception 07/23/2017  . Prediabetes 07/23/2017  . History of gestational diabetes 07/23/2017  . Obesity (BMI 30-39.9) 06/24/2014  . Hypothyroidism 07/09/2010    Madelyn Flavors PT 10/07/2019, 12:41 PM  [facility] [place], [place], [address] Phone: [phone]   Fax:  [phone]  Name: Elise Coop MRN: [id number] Date of Birth: 1976/01/23

## 2019-10-09 ENCOUNTER — Other Ambulatory Visit: Payer: Self-pay

## 2019-10-09 ENCOUNTER — Encounter: Payer: Self-pay | Admitting: Physical Therapy

## 2019-10-09 ENCOUNTER — Ambulatory Visit (INDEPENDENT_AMBULATORY_CARE_PROVIDER_SITE_OTHER): Payer: Commercial Managed Care - PPO | Admitting: Physical Therapy

## 2019-10-09 DIAGNOSIS — M542 Cervicalgia: Secondary | ICD-10-CM

## 2019-10-09 DIAGNOSIS — M5416 Radiculopathy, lumbar region: Secondary | ICD-10-CM

## 2019-10-09 DIAGNOSIS — M79601 Pain in right arm: Secondary | ICD-10-CM | POA: Diagnosis not present

## 2019-10-09 NOTE — Therapy (Signed)
[place], [place], [address] Phone: [phone]   Fax:  [phone]  Physical Therapy Treatment  Patient Details  Name: Beverly [name] MRN: [id number] Date of Birth: Oct 17, 1976 Referring Provider (PT): Silverio Decamp, MD   Encounter Date: 10/09/2019  PT End of Session - 10/09/19 0849    Visit Number  4    Number of Visits  12    Date for PT Re-Evaluation  11/07/19    Authorization Type  [employer] UMR    PT Start Time  [phone]    PT Stop Time  0930    PT Time Calculation (min)  44 min    Activity Tolerance  Patient tolerated treatment well    Behavior During Therapy  [facility] for tasks assessed/performed       Past Medical History:  Diagnosis Date  . Diabetes mellitus   . PCOS (polycystic ovarian syndrome)   . Thyroid disease    hashimoto's    Past Surgical History:  Procedure Laterality Date  . BREAST BIOPSY    . WISDOM TOOTH EXTRACTION      There were no vitals filed for this visit.  Subjective Assessment - 10/09/19 0849    Subjective  Felt good after last visit for most of the day, but tingling in back of neck and into just pinky finger today. It was 3 fingers before. Stiffness also in low back.    Patient Stated Goals  get the pain down, get back some strenght    Currently in Pain?  Yes    Pain Score  4     Pain Location  Neck    Pain Orientation  Right    Pain Score  4    Pain Location  Back    Pain Orientation  Right                       [facility] Adult PT Treatment/Exercise - 10/09/19 0001      Neck Exercises: Standing   Neck Retraction  10 reps;3 secs    Neck Retraction Limitations  on noodle    Upper Extremity Flexion with Stabilization  Flexion;10 reps    UE Flexion with Stabilization Limitations  on noodle using red band    Other Standing Exercises  scap retraction 10 x 3 sec hold;; still feels pull around ribs with Ws    Other Standing Exercises   horizontal ABD on noodle red x 10, rows red x 10; extension red x 10      Lumbar Exercises: Stretches   Other Lumbar Stretch Exercise  SDLY QL stretch x 1 min      Lumbar Exercises: Aerobic   UBE (Upper Arm Bike)  L1 x 4 in 2/2 with ab set      Lumbar Exercises: Standing   Other Standing Lumbar Exercises  wall angel x 2 (pulls in right scapula and low back)      Manual Therapy   Manual Therapy  Soft tissue mobilization    Manual therapy comments  skilled palpation and monitoring of soft tissue during DN     Soft tissue mobilization  to right QL/lumbar in [facility] and UT, levator and cervcial paraspinals in prone       Trigger Point Dry Needling - 10/09/19 0001    Consent Given?  Yes    Education Handout Provided  Yes    Muscles Treated Head and Neck  Upper trapezius;Oblique capitus;Cervical multifidi  Muscles Treated Back/Hip  Quadratus lumborum;Erector spinae    Dry Needling Comments  right side    Upper Trapezius Response  Twitch reponse elicited;Palpable increased muscle length    Oblique Capitus Response  Palpable increased muscle length    Cervical multifidi Response  Twitch reponse elicited;Palpable increased muscle length    Erector spinae Response  Twitch response elicited;Palpable increased muscle length    Quadratus Lumborum Response  Twitch response elicited;Palpable increased muscle length           PT Education - 10/09/19 0927    Education Details  DN education and aftercare    Person(s) Educated  Patient    Methods  Explanation;Handout    Comprehension  Verbalized understanding          PT Long Term Goals - 10/09/19 0951      PT LONG TERM GOAL #1   Title  Pt will be I and compliant with HEP and verbalize plan for continued exercise post PT discharge. (Target goal for all goals 6 weeks 11/07/19)    Status  On-going      PT LONG TERM GOAL #2   Title  Pt will improve neck and lumbar ROM to [facility] to improve mobility    Status  On-going      PT LONG TERM  GOAL #3   Title  Pt will improve overall strength to at least 5-/5 in UE and 4+ in LE all grossly tested in siitting MMT to improve function    Status  On-going      PT LONG TERM GOAL #4   Title  Pt will reduce pain by overall 50% with improved activity tolerance with usual activity to >30 minutes    Status  On-going            Plan - 10/09/19 0941    Clinical Impression Statement  Patient presents today with improvement of sx in RUE. She still feels tingling in digit 5 and behind neck and is stiff in low back. She has significant tightness in right low back and lats with TE, but this resolved after DN. She had less tension in right scalenes and SCM today, but TPs in cervical multifidi and UT which also responded well to DN. She also denied N/T in digit 5 at end of treatment. She still had a positive ulnar nerve tension test feeliing it at the elbow and neck.    Comorbidities  chronic pain since 1996, DM    PT Frequency  2x / week    PT Duration  6 weeks    PT Treatment/Interventions  ADLs/Self Care Home Management;Aquatic Therapy;Cryotherapy;Electrical Stimulation;Iontophoresis 4mg /ml Dexamethasone;Moist Heat;Traction;Ultrasound;Stair training;Therapeutic exercise;Balance training;Neuromuscular re-education;Therapeutic activities;Patient/family education;Manual techniques;Passive range of motion;Dry needling;Energy conservation;Joint Manipulations;Spinal Manipulations;Taping;Vasopneumatic Device    PT Next Visit Plan  Assess DN, continue PRN. Progress HEP for core and stabilization.    PT Home Exercise Plan  Access Code: [email or web address]    Consulted and Agree with Plan of Care  Patient       Patient will benefit from skilled therapeutic intervention in order to improve the following deficits and impairments:  Decreased activity tolerance, Decreased endurance, Decreased range of motion, Decreased strength, Difficulty walking, Increased muscle spasms, Postural dysfunction, Pain  Visit  Diagnosis: Cervicalgia  Radiculopathy, lumbar region  Pain in right arm     Problem List Patient Active Problem List   Diagnosis Date Noted  . Radiculitis of right cervical region 08/04/2019  . Right lumbar radiculitis 08/04/2019  . Chronic pain  of right knee 08/04/2019  . PCOS (polycystic ovarian syndrome) 07/23/2017  . Relies on partner vasectomy for contraception 07/23/2017  . Prediabetes 07/23/2017  . History of gestational diabetes 07/23/2017  . Obesity (BMI 30-39.9) 06/24/2014  . Hypothyroidism 07/09/2010    Madelyn Flavors PT 10/09/2019, 9:52 AM  [facility] [place], [place], [address] Phone: [phone]   Fax:  [phone]  Name: Gaya Bankes MRN: [id number] Date of Birth: 09-15-1976

## 2019-10-09 NOTE — Patient Instructions (Signed)
Trigger Point Dry Needling  . What is Trigger Point Dry Needling (DN)? o DN is a physical therapy technique used to treat muscle pain and dysfunction. Specifically, DN helps deactivate muscle trigger points (muscle knots).  o A thin filiform needle is used to penetrate the skin and stimulate the underlying trigger point. The goal is for a local twitch response (LTR) to occur and for the trigger point to relax. No medication of any kind is injected during the procedure.   . What Does Trigger Point Dry Needling Feel Like?  o The procedure feels different for each individual patient. Some patients report that they do not actually feel the needle enter the skin and overall the process is not painful. Very mild bleeding may occur. However, many patients feel a deep cramping in the muscle in which the needle was inserted. This is the local twitch response.   Marland Kitchen How Will I feel after the treatment? o Soreness is normal, and the onset of soreness may not occur for a few hours. Typically this soreness does not last longer than two days.  o Bruising is uncommon, however; ice can be used to decrease any possible bruising.  o In rare cases feeling tired or nauseous after the treatment is normal. In addition, your symptoms may get worse before they get better, this period will typically not last longer than 24 hours.   . What Can I do After My Treatment? o Increase your hydration by drinking more water for the next 24 hours. o You may place ice or heat on the areas treated that have become sore, however, do not use heat on inflamed or bruised areas. Heat often brings more relief post needling. o You can continue your regular activities, but vigorous activity is not recommended initially after the treatment for 24 hours. o DN is best combined with other physical therapy such as strengthening, stretching, and other therapies.   Madelyn Flavors, PT 10/09/19 9:27 AM  [facility] [place] [address] [place], [place] [address]  [phone] (office) [phone] (fax)

## 2019-10-13 ENCOUNTER — Encounter: Payer: Self-pay | Admitting: Physical Therapy

## 2019-10-13 ENCOUNTER — Ambulatory Visit (INDEPENDENT_AMBULATORY_CARE_PROVIDER_SITE_OTHER): Payer: Commercial Managed Care - PPO | Admitting: Sports Medicine

## 2019-10-13 ENCOUNTER — Encounter: Payer: Self-pay | Admitting: Sports Medicine

## 2019-10-13 ENCOUNTER — Other Ambulatory Visit: Payer: Self-pay

## 2019-10-13 ENCOUNTER — Ambulatory Visit (INDEPENDENT_AMBULATORY_CARE_PROVIDER_SITE_OTHER): Payer: Commercial Managed Care - PPO | Admitting: Physical Therapy

## 2019-10-13 DIAGNOSIS — M542 Cervicalgia: Secondary | ICD-10-CM

## 2019-10-13 DIAGNOSIS — M5416 Radiculopathy, lumbar region: Secondary | ICD-10-CM | POA: Diagnosis not present

## 2019-10-13 DIAGNOSIS — M25561 Pain in right knee: Secondary | ICD-10-CM | POA: Diagnosis not present

## 2019-10-13 DIAGNOSIS — M79601 Pain in right arm: Secondary | ICD-10-CM | POA: Diagnosis not present

## 2019-10-13 DIAGNOSIS — G8929 Other chronic pain: Secondary | ICD-10-CM | POA: Diagnosis not present

## 2019-10-13 DIAGNOSIS — M5412 Radiculopathy, cervical region: Secondary | ICD-10-CM

## 2019-10-13 NOTE — Assessment & Plan Note (Signed)
Osteoarthritis and meniscal tearing on MRI, much better after injection.

## 2019-10-13 NOTE — Therapy (Signed)
[place], [place], [address] Phone: [phone]   Fax:  [phone]  Physical Therapy Treatment  Patient Details  Name: Beverly [name] MRN: [id number] Date of Birth: [birth year] Referring Provider (PT): Silverio Decamp, MD   Encounter Date: 10/13/2019  PT End of Session - 10/13/19 0850    Visit Number  5    Number of Visits  12    Date for PT Re-Evaluation  11/07/19    Authorization Type  UHC UMR    PT Start Time  0848    PT Stop Time  0930    PT Time Calculation (min)  42 min    Activity Tolerance  Patient tolerated treatment well    Behavior During Therapy  [facility] for tasks assessed/performed       Past Medical History:  Diagnosis Date  . Diabetes mellitus   . PCOS (polycystic ovarian syndrome)   . Thyroid disease    hashimoto's    Past Surgical History:  Procedure Laterality Date  . BREAST BIOPSY    . WISDOM TOOTH EXTRACTION      There were no vitals filed for this visit.  Subjective Assessment - 10/13/19 0850    Subjective  Back is stiff. Hurts when I move it. Tinging in [place]. Relief x 3 days with DN but then painted yesterday and symptoms are worse.    Diagnostic tests  see chart, neck MRI, lumbar MRI, knee MRI    Patient Stated Goals  get the pain down, get back some strenght    Currently in Pain?  Yes    Pain Score  2     Pain Location  Elbow    Pain Orientation  Right    Pain Descriptors / Indicators  Tingling    Pain Type  Chronic pain    Multiple Pain Sites  Yes    Pain Score  4    Pain Location  Back    Pain Orientation  Right    Pain Descriptors / Indicators  Sharp    Pain Type  Chronic pain    Pain Radiating Towards  into tailbone         [facility] PT Assessment - 10/13/19 0001      Strength   Overall Strength Comments  Rt shoulder ABD and ER 4-/5; flex/ext 4+/5, IR 5/5, elbow ext 4- to 4+ with repetitive testing, wrist flex 4+/5; left hip flex 4-/5 with poor  lumbar stab; Rt 4/5; knee ext 4+/5, flex 5/5      Special Tests   Other special tests  seated traction gives feeling of warmth increased blood flow to arm; supine in 30 deg flexion no change in UE sx                   [facility] Adult PT Treatment/Exercise - 10/13/19 0001      Neck Exercises: Standing   Neck Retraction  5 reps    Neck Retraction Limitations  increased pain in neck       Lumbar Exercises: Aerobic   UBE (Upper Arm Bike)  L1 x 4 in 2/2 with ab set      Manual Therapy   Manual Therapy  Soft tissue mobilization    Manual therapy comments  skilled palpation and monitoring of soft tissue during DN     Soft tissue mobilization  IASTM to Rt posterior upper arm       Trigger Point Dry Needling - 10/13/19 0001  Consent Given?  Yes    Education Handout Provided  Previously provided    Muscles Treated Head and Neck  Upper trapezius;Suboccipitals;Splenius capitus;Cervical multifidi    Dry Needling Comments  right side    Upper Trapezius Response  Twitch reponse elicited;Palpable increased muscle length    Splenius capitus Response  Palpable increased muscle length    Cervical multifidi Response  Twitch reponse elicited;Palpable increased muscle length                PT Long Term Goals - 10/13/19 0854      PT LONG TERM GOAL #1   Title  Pt will be I and compliant with HEP and verbalize plan for continued exercise post PT discharge. (Target goal for all goals 6 weeks 11/07/19)    Baseline  doesn't do ULTT due to pain    Status  Partially Met      PT LONG TERM GOAL #2   Title  Pt will improve neck and lumbar ROM to [facility] to improve mobility    Baseline  cerv rotation WNL; flex/ext and SB increases sx; lumbar SB WNL flex and ext cause increased pain in mid lumbar and to right    Status  On-going      PT LONG TERM GOAL #3   Title  Pt will improve overall strength to at least 5-/5 in UE and 4+ in LE all grossly tested in siitting MMT to improve function     Status  On-going      PT LONG TERM GOAL #4   Title  Pt will reduce pain by overall 50% with improved activity tolerance with usual activity to >30 minutes    Baseline  relief x 3 days in neck/arm after DN    Status  On-going            Plan - 10/13/19 1633    Clinical Impression Statement  Patient presents with increased sx in RUE today after painting yesterday. She had had 3 days with decreased sx after last treatment. She has decreased strength in RUE and BLE since eval. She responded very well to DN today and to IASTM and had decreased ulnar tension in RUE at end of treatment.    Comorbidities  chronic pain since 1996, DM    PT Treatment/Interventions  ADLs/Self Care Home Management;Aquatic Therapy;Cryotherapy;Electrical Stimulation;Iontophoresis '4mg'$ /ml Dexamethasone;Moist Heat;Traction;Ultrasound;Stair training;Therapeutic exercise;Balance training;Neuromuscular re-education;Therapeutic activities;Patient/family education;Manual techniques;Passive range of motion;Dry needling;Energy conservation;Joint Manipulations;Spinal Manipulations;Taping;Vasopneumatic Device    PT Next Visit Plan  Assess DN. Progress HEP for core and stabilization.    PT Home Exercise Plan  Access Code: [email or web address]       Patient will benefit from skilled therapeutic intervention in order to improve the following deficits and impairments:  Decreased activity tolerance, Decreased endurance, Decreased range of motion, Decreased strength, Difficulty walking, Increased muscle spasms, Postural dysfunction, Pain  Visit Diagnosis: Pain in right arm  Radiculopathy, lumbar region  Cervicalgia     Problem List Patient Active Problem List   Diagnosis Date Noted  . Radiculitis of right cervical region 08/04/2019  . Lumbar radiculitis 08/04/2019  . Chronic pain of right knee 08/04/2019  . PCOS (polycystic ovarian syndrome) 07/23/2017  . Relies on partner vasectomy for contraception 07/23/2017  . Prediabetes  07/23/2017  . History of gestational diabetes 07/23/2017  . Obesity (BMI 30-39.9) 06/24/2014  . Hypothyroidism 07/09/2010    Madelyn Flavors PT 10/13/2019, 4:43 PM  [place] [address] [place], [place],  [place] Phone: [phone]   Fax:  [phone]  Name: Beverly [name] MRN: [id number] Date of Birth: [birth year]

## 2019-10-13 NOTE — Progress Notes (Signed)
Subjective:    CC: Follow-up  HPI: Right cervical radiculopathy: Now with some progressive weakness, did not respond to cervical epidural or therapy.  Right knee pain: MRI showed osteoarthritis and meniscal tearing, resolved after injection.  Lumbar radiculitis: With bilateral S1 radicular symptoms, right worse than left, no better with conservative treatment.  I reviewed the past medical history, family history, social history, surgical history, and allergies today and no changes were needed.  Please see the problem list section below in epic for further details.  Past Medical History: Past Medical History:  Diagnosis Date  . Diabetes mellitus   . PCOS (polycystic ovarian syndrome)   . Thyroid disease    hashimoto's   Past Surgical History: Past Surgical History:  Procedure Laterality Date  . BREAST BIOPSY    . WISDOM TOOTH EXTRACTION     Social History: Social History   Socioeconomic History  . Marital status: Married    Spouse name: Not on file  . Number of children: Not on file  . Years of education: Not on file  . Highest education level: Not on file  Occupational History  . Occupation: homemaker  Social Needs  . Financial resource strain: Not on file  . Food insecurity    Worry: Not on file    Inability: Not on file  . Transportation needs    Medical: Not on file    Non-medical: Not on file  Tobacco Use  . Smoking status: Never Smoker  . Smokeless tobacco: Never Used  Substance and Sexual Activity  . Alcohol use: Yes    Comment: social  . Drug use: No  . Sexual activity: Yes    Partners: Male    Birth control/protection: None    Comment: vasectomy  Lifestyle  . Physical activity    Days per week: Not on file    Minutes per session: Not on file  . Stress: Not on file  Relationships  . Social Herbalist on phone: Not on file    Gets together: Not on file    Attends religious service: Not on file    Active member of club or  organization: Not on file    Attends meetings of clubs or organizations: Not on file    Relationship status: Not on file  Other Topics Concern  . Not on file  Social History Narrative  . Not on file   Family History: Family History  Problem Relation Age of Onset  . Thyroid disease Mother   . Cancer Paternal Grandmother        breast  . Cancer Maternal Grandmother        breast CA  . Thyroid disease Maternal Grandmother   . Diabetes Maternal Grandfather    Allergies: Allergies  Allergen Reactions  . Eggs Or Egg-Derived Products Rash  . Metformin And Related Diarrhea   Medications: See med rec.  Review of Systems: No fevers, chills, night sweats, weight loss, chest pain, or shortness of breath.   Objective:    General: Well Developed, well nourished, and in no acute distress.  Neuro: Alert and oriented x3, extra-ocular muscles intact, sensation grossly intact.  HEENT: Normocephalic, atraumatic, pupils equal round reactive to light, neck supple, no masses, no lymphadenopathy, thyroid nonpalpable.  Skin: Warm and dry, no rashes. Cardiac: Regular rate and rhythm, no murmurs rubs or gallops, no lower extremity edema.  Respiratory: Clear to auscultation bilaterally. Not using accessory muscles, speaking in full sentences.  Lumbar spine MRI personally  reviewed, multilevel spondylosis, dominant finding is a L5-S1 central disc protrusion contacting but not clearly compressing both S1 nerve roots.  Impression and Recommendations:    Chronic pain of right knee Osteoarthritis and meniscal tearing on MRI, much better after injection.  Radiculitis of right cervical region Persistent right C8 distribution radiculitis, no better after cervical epidural, there is some worsening subjective weakness, I would like a surgical opinion from Dr. Lynann Bologna.  Lumbar radiculitis Currently having bilateral S1 radicular symptoms, review of the MRI does reveal contact of the L5-S1 disc with both S1  nerve roots. We are going to proceed with a bilateral S1 selective nerve root epidural.   ___________________________________________ Gwen Her. Dianah Field, M.D., ABFM., CAQSM. Primary Care and Sports Medicine  [facility]  Adjunct Professor of [employer]

## 2019-10-13 NOTE — Assessment & Plan Note (Signed)
Currently having bilateral S1 radicular symptoms, review of the MRI does reveal contact of the L5-S1 disc with both S1 nerve roots. We are going to proceed with a bilateral S1 selective nerve root epidural.

## 2019-10-13 NOTE — Assessment & Plan Note (Signed)
Persistent right C8 distribution radiculitis, no better after cervical epidural, there is some worsening subjective weakness, I would like a surgical opinion from Dr. Lynann Bologna.

## 2019-10-16 ENCOUNTER — Other Ambulatory Visit: Payer: Self-pay

## 2019-10-16 ENCOUNTER — Ambulatory Visit (INDEPENDENT_AMBULATORY_CARE_PROVIDER_SITE_OTHER): Payer: Commercial Managed Care - PPO | Admitting: Physical Therapy

## 2019-10-16 DIAGNOSIS — M5416 Radiculopathy, lumbar region: Secondary | ICD-10-CM | POA: Diagnosis not present

## 2019-10-16 DIAGNOSIS — M542 Cervicalgia: Secondary | ICD-10-CM | POA: Diagnosis not present

## 2019-10-16 DIAGNOSIS — M79601 Pain in right arm: Secondary | ICD-10-CM | POA: Diagnosis not present

## 2019-10-16 NOTE — Patient Instructions (Addendum)
Lower abdominal/core stability exercises  1. Practice your breathing technique: Inhale through your nose expanding your belly and rib cage. Try not to breathe into your chest. Exhale slowly and gradually out your mouth feeling a sense of softness to your body. Practice multiple times. This can be performed unlimited.  2. Finding the lower abdominals. Laying on your back with the knees bent, place your fingers just below your belly button. Using your breathing technique from above, on your exhale gently pull the belly button away from your fingertips without tensing any other muscles. Practice this 5x. Next, as you exhale, draw belly button inwards and hold onto it...then feel as if you are pulling that muscle across your pelvis like you are tightening a belt. This can be hard to do at first so be patient and practice. Do 5-10 reps 1-3 x day. Always recognize quality over quantity; if your abdominal muscles become tired you will notice you may tighten/contract other muscles. This is the time to take a break.   Practice this first laying on your back, then in sitting, progressing to standing and finally adding it to all your daily movements.   3. Finding your pelvic floor. Using the breathing technique above, when your exhale, this time draw your pelvic floor muscles up as if you were attempting to stop the flow of urination. Be careful NOT to tense any other muscles. This can be hard, BE PATIENT. Try to hold up to 10 seconds repeating 10x. Try 2x a day. Once you feel you are doing this well, add this contraction to exercise #2. First contracting your pelvic floor followed by lower abdominals.   4. Adding leg movements. Add the following leg movements to challenge your ability to keep your core stable:  1. Single leg drop outs: Laying on your back with knees bent feet flat. Inhale,  dropping one knee outward KEEPING YOUR PELVIS STILL. Exhale as you bring the leg back, simultaneously performing your lower  abdominal contraction. Do 5-10 on each leg.   2. Marching: While keeping your pelvis still, lift the right foot a few inches, put it down then lift left foot. This will mimic a march. Start slow to establish control. Once you have control you may speed it up. Do 10-20x. You MUST keep your lower abdominlas contracted while you march. Breathe naturally    3. Single leg slides: Inhale while you slowly slide one leg out keeping your pelvis still. Only slide your leg as far as you can keep your pelvis still. Exhale as you bring the leg back to the start, contracting the lower abdominals as you do that. Keep your upper body relaxed. Do 5-10 on each side.     Access Code: [email or web address]  URL: [email or web address]  Date: 10/16/2019  Prepared by: Almyra Free Nobel Brar   Exercises Seated Cervical Retraction - 10 reps - 2-3 sets - 2x daily - 6x weekly Seated Cervical Sidebending Stretch - 3 sets - 30 sec hold - 2x daily - 6x weekly Ulnar Nerve Flossing - 10 reps - 1 sets - 2x daily - 6x weekly Seated Assisted Cervical Rotation with Towel - 10 reps - 1 sets - 5 hold - 2x daily - 6x weekly Supine Lower Trunk Rotation - 10 reps - 1 sets - 5 sec hold - 2x daily - 6x weekly Standing Lumbar Extension - 10 reps - 1-2 sets - 5 hold - 2x daily - 6x weekly Prone Transversus Abdominus Contraction - 10 reps - 1 sets - 5  hold - 2x daily - 7x weekly Supine Transversus Abdominis Palpation - 10 reps - 1 sets - 5 hold - 2x daily - 7x weekly Bridge with Hip Abduction and Resistance - Ground Touches - 10 reps - 3 sets - 1x daily - 7x weekly Hip Extension with Leg Straight - 10 reps - 3 sets - 1x daily - 7x weekly Prone Alternating Arm and Leg Lifts - 10 reps - 3 sets - 5 sec hold                            - 1x daily - 7x weekly

## 2019-10-16 NOTE — Therapy (Addendum)
[place], [place], [address] Phone: [phone]   Fax:  [phone]  Physical Therapy Treatment and Discharge Summary  Patient Details  Name: Beverly [name] MRN: [id number] Date of Birth: 11-24-1975 Referring Provider (PT): Silverio Decamp, MD   Encounter Date: 10/16/2019  PT End of Session - 10/16/19 0850    Visit Number  6    Number of Visits  12    Date for PT Re-Evaluation  11/07/19    Authorization Type  UHC UMR    PT Start Time  0848    PT Stop Time  0932    PT Time Calculation (min)  44 min    Activity Tolerance  Patient tolerated treatment well    Behavior During Therapy  [facility] for tasks assessed/performed       Past Medical History:  Diagnosis Date  . Diabetes mellitus   . PCOS (polycystic ovarian syndrome)   . Thyroid disease    hashimoto's    Past Surgical History:  Procedure Laterality Date  . BREAST BIOPSY    . WISDOM TOOTH EXTRACTION      There were no vitals filed for this visit.  Subjective Assessment - 10/16/19 0851    Subjective  Patient has had a HA since last visit. She is seeing a Psychologist, sport and exercise for her neck and having an epidural in her back.    Patient Stated Goals  get the pain down, get back some strenght    Currently in Pain?  Yes    Pain Score  7     Pain Location  Neck    Pain Orientation  Right    Pain Descriptors / Indicators  Sharp;Burning    Pain Type  Chronic pain    Pain Radiating Towards  into arm and elbow (sharp stabbing)         [facility] PT Assessment - 10/16/19 0001      Strength   Overall Strength Comments  Rt shoulder ABD and ER 4-/5; flex/ext 4+/5, IR 5/5, elbow ext 4- to 4+ with repetitive testing, wrist flex 4+/5; left hip flex 4-/5 with poor lumbar stab; Rt 4/5; knee ext 4+/5, flex 5/5   tested 10/13/19                  [facility] Adult PT Treatment/Exercise - 10/16/19 0001      Lumbar Exercises: Aerobic   UBE (Upper Arm  Bike)  L1 x 4 in 2/2 with ab set      Lumbar Exercises: Supine   Bridge  5 reps    Bridge with clamshell  10 reps    Bridge with March  5 reps    Bridge with [employer] Limitations  unable to lift right leg to perform.    Straight Leg Raise  5 reps    Straight Leg Raises Limitations  left only; pt unable to lift more than 2 inch with RLE    Large Ball Abdominal Isometric  5 seconds    Large Ball Abdominal Isometric Limitations  90/90 position      Lumbar Exercises: Prone   Straight Leg Raise  10 reps   bil   Straight Leg Raises Limitations  over pillow    Opposite Arm/Leg Raise  Right arm/Left leg;Left arm/Right leg;10 reps    Opposite Arm/Leg Raise Limitations  over pillow      Manual Therapy   Manual Therapy  Soft tissue mobilization    Soft tissue mobilization  IASTM along  Rt ulnar nerve distribution in loaded position supine; to Rt UT, cervical paraspinals and suboccipitals in siting                  PT Long Term Goals - 10/16/19 1354      PT LONG TERM GOAL #1   Title  Pt will be I and compliant with HEP and verbalize plan for continued exercise post PT discharge. (Target goal for all goals 6 weeks 11/07/19)    Status  Partially Met      PT LONG TERM GOAL #2   Title  Pt will improve neck and lumbar ROM to [facility] to improve mobility    Baseline  cerv rotation WNL; flex/ext and SB increases sx; lumbar SB WNL flex and ext cause increased pain in mid lumbar and to right    Status  Partially Met      PT LONG TERM GOAL #3   Title  Pt will improve overall strength to at least 5-/5 in UE and 4+ in LE all grossly tested in siitting MMT to improve function    Status  On-going      PT LONG TERM GOAL #4   Title  Pt will reduce pain by overall 50% with improved activity tolerance with usual activity to >30 minutes    Status  On-going            Plan - 10/16/19 1351    Clinical Impression Statement  Patient presents with coninued pain in Rt neck and UE to elbow. She  is seeing a Psychologist, sport and exercise next week and is scheduled to have an epidural in her low back. We reviewed and progressed her HEP. She did well with IASTM to Rt upper quadrant and had no pain in arm at end of treatment. She still had right neck pain and HA at end of treatment. Plan to put patient on hold for 30 days to see outcome of MD visits. HEP was progressed and reviewed.    Personal Factors and Comorbidities  Comorbidity 1;Past/Current Experience;Time since onset of injury/illness/exacerbation    Comorbidities  chronic pain since 1996, DM    PT Treatment/Interventions  ADLs/Self Care Home Management;Aquatic Therapy;Cryotherapy;Electrical Stimulation;Iontophoresis 3m/ml Dexamethasone;Moist Heat;Traction;Ultrasound;Stair training;Therapeutic exercise;Balance training;Neuromuscular re-education;Therapeutic activities;Patient/family education;Manual techniques;Passive range of motion;Dry needling;Energy conservation;Joint Manipulations;Spinal Manipulations;Taping;Vasopneumatic Device    PT Next Visit Plan  Hold for 30 days as pt seeing surgeon and getting epidural.    PT Home Exercise Plan  Access Code: [email or web address]   Consulted and Agree with Plan of Care  Patient       Patient will benefit from skilled therapeutic intervention in order to improve the following deficits and impairments:  Decreased activity tolerance, Decreased endurance, Decreased range of motion, Decreased strength, Difficulty walking, Increased muscle spasms, Postural dysfunction, Pain  Visit Diagnosis: Cervicalgia  Radiculopathy, lumbar region  Pain in right arm     Problem List Patient Active Problem List   Diagnosis Date Noted  . Radiculitis of right cervical region 08/04/2019  . Lumbar radiculitis 08/04/2019  . Chronic pain of right knee 08/04/2019  . PCOS (polycystic ovarian syndrome) 07/23/2017  . Relies on partner vasectomy for contraception 07/23/2017  . Prediabetes 07/23/2017  . History of gestational diabetes  07/23/2017  . Obesity (BMI 30-39.9) 06/24/2014  . Hypothyroidism 07/09/2010    JMadelyn FlavorsPT 10/16/2019, 8:12 PM  [facility] [place] [address]Phone: [phone]  Fax:  [phone]  Name: Beverly [name] MRN: [id number] Date of Birth: [birth year]  PHYSICAL THERAPY DISCHARGE SUMMARY  Visits from Start of Care: 6  Current functional level related to goals / functional outcomes: See above   Remaining deficits: See above   Education / Equipment: HEP Plan: Patient agrees to discharge.  Patient goals were partially met. Patient is being discharged due to not returning since the last visit.  ?????    Madelyn Flavors, PT 11/24/19 10:43 AM;  [facility] at [place], [place] [address]  [phone] (office) [phone] (fax)

## 2019-10-24 ENCOUNTER — Ambulatory Visit
Admission: RE | Admit: 2019-10-24 | Discharge: 2019-10-24 | Disposition: A | Discharge status: Home / Self Care | Payer: Commercial Managed Care - PPO | Source: Ambulatory Visit | Attending: Sports Medicine | Admitting: Sports Medicine

## 2019-10-24 ENCOUNTER — Other Ambulatory Visit: Payer: Self-pay | Admitting: Sports Medicine

## 2019-10-24 DIAGNOSIS — M5416 Radiculopathy, lumbar region: Secondary | ICD-10-CM

## 2019-10-24 MED ORDER — METHYLPREDNISOLONE ACETATE 40 MG/ML INJ SUSP (RADIOLOG
120.0000 mg | Freq: Once | INTRAMUSCULAR | Status: AC
  Administered 2019-10-24: 10:00:00 120 mg via EPIDURAL

## 2019-10-24 MED ORDER — IOPAMIDOL (ISOVUE-M 200) INJECTION 41%
1.0000 mL | Freq: Once | INTRAMUSCULAR | Status: AC
  Administered 2019-10-24: 10:00:00 1 mL via EPIDURAL

## 2019-10-24 NOTE — Discharge Instructions (Signed)

## 2019-11-13 ENCOUNTER — Other Ambulatory Visit: Payer: Self-pay

## 2019-11-13 ENCOUNTER — Ambulatory Visit (INDEPENDENT_AMBULATORY_CARE_PROVIDER_SITE_OTHER): Payer: BC Managed Care – PPO | Admitting: Sports Medicine

## 2019-11-13 DIAGNOSIS — G8929 Other chronic pain: Secondary | ICD-10-CM

## 2019-11-13 DIAGNOSIS — M25561 Pain in right knee: Secondary | ICD-10-CM

## 2019-11-13 DIAGNOSIS — M5412 Radiculopathy, cervical region: Secondary | ICD-10-CM | POA: Diagnosis not present

## 2019-11-13 DIAGNOSIS — M5416 Radiculopathy, lumbar region: Secondary | ICD-10-CM

## 2019-11-13 DIAGNOSIS — N62 Hypertrophy of breast: Secondary | ICD-10-CM

## 2019-11-13 NOTE — Assessment & Plan Note (Signed)
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 Lynann Bologna, he is not certain that there is surgical pathology. Because she had some paresthesias in the right side of her face he would like a neurology consult and nerve conduction study. Though theoretically the cervical nerve roots should not have any innervation in the neck, practically occasionally we do see patients complained of paresthesias in their face with a primary cervical radicular pathology. She did not respond to a cervical epidural. 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 This could certainly have a beneficial effect on her neck pain.  She has significant grooving over her shoulders from her bra strap as well.

## 2019-11-13 NOTE — Assessment & Plan Note (Signed)
Beverly [name] has bilateral S1 radiculitis, we referred her for bilateral S1 selective epidurals and she had essentially complete resolution of her symptoms. I think she overdid it on the recumbent bike and worsened her back pain but this will likely improve. If persistence after a month or 2 we can set her up for a second set of bilateral S1 selective epidurals.

## 2019-11-13 NOTE — Assessment & Plan Note (Signed)
Referral to plastic surgery

## 2019-11-13 NOTE — Progress Notes (Signed)
    Procedures performed today:    None.  Independent interpretation of tests performed by another provider:   None.  Impression and Recommendations:    Chronic pain of right knee Persistent pain, we did an injection back in November. She also has some mechanical symptoms. MRI showed arthritis and meniscal tearing. Because of the persistence of [name] symptoms I would like a second opinion from Dr. Rhona Raider to discuss arthroscopy.   Lumbar radiculitis Beverly [name] has bilateral S1 radiculitis, we referred [name] for bilateral S1 selective epidurals and she had essentially complete resolution of [name] symptoms. I think she overdid it on the recumbent bike and worsened [name] back pain but this will likely improve. If persistence after a month or 2 we can set [name] up for a second set of bilateral S1 selective epidurals.  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 Lynann Bologna, he is not certain that there is surgical pathology. Because she had some paresthesias in the right side of [name] face he would like a neurology consult and nerve conduction study. Though theoretically the cervical nerve roots should not have any innervation in the neck, practically occasionally we do see patients complained of paresthesias in their face with a primary cervical radicular pathology. She did not respond to a cervical epidural. 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 This could certainly have a beneficial effect on [name] neck pain.  She has significant grooving over [name] shoulders from [name] bra strap as well.   Macromastia Referral to plastic surgery.    ___________________________________________ Beverly [name]. Dianah Field, M.D., ABFM., CAQSM. Primary Care and [place] Instructor of [place] of [facility] of  Medicine

## 2019-11-13 NOTE — Assessment & Plan Note (Signed)
Persistent pain, we did an injection back in November. She also has some mechanical symptoms. MRI showed arthritis and meniscal tearing. Because of the persistence of her symptoms I would like a second opinion from Dr. Rhona Raider to discuss arthroscopy.

## 2019-11-26 DIAGNOSIS — G5601 Carpal tunnel syndrome, right upper limb: Secondary | ICD-10-CM | POA: Diagnosis not present

## 2019-11-27 DIAGNOSIS — N62 Hypertrophy of breast: Secondary | ICD-10-CM | POA: Diagnosis not present

## 2019-11-28 DIAGNOSIS — M542 Cervicalgia: Secondary | ICD-10-CM | POA: Diagnosis not present

## 2019-11-28 DIAGNOSIS — G5601 Carpal tunnel syndrome, right upper limb: Secondary | ICD-10-CM | POA: Diagnosis not present

## 2019-12-01 DIAGNOSIS — M25561 Pain in right knee: Secondary | ICD-10-CM | POA: Diagnosis not present

## 2019-12-11 ENCOUNTER — Other Ambulatory Visit: Payer: Self-pay

## 2019-12-11 ENCOUNTER — Ambulatory Visit (INDEPENDENT_AMBULATORY_CARE_PROVIDER_SITE_OTHER): Payer: BC Managed Care – PPO | Admitting: Sports Medicine

## 2019-12-11 DIAGNOSIS — M5412 Radiculopathy, cervical region: Secondary | ICD-10-CM | POA: Diagnosis not present

## 2019-12-11 NOTE — Progress Notes (Signed)
    Procedures performed today:    None.  Independent interpretation of tests performed by another provider:   None.  Impression and Recommendations:    Radiculitis of right cervical region Beverly [name] returns, she has multilevel cervical DDD, she failed a couple of epidurals, physical therapy. I referred her to Dr. Lynann Bologna, they do not feel as though there is any definite surgical pathology, Beverly Holm, PA-C started her on gabapentin which I agree with. She did start a 3 times daily and felt excessive sedation so I have recommended that she drop back down to 300 mg at night for a week and then up to twice daily if needed. She has noted some improvement in her symptoms. Certainly her large pendulous breasts are a problem as well, she feels her neck pain go away when she lifts her bra with her hands. Unfortunately she is not going to be able to get a breast reduction and lift through her insurance and plans to get a loan to help pay for this. I like to see her back approximately 2 months after her surgery.    ___________________________________________ Beverly Her. Dianah Field, M.D., ABFM., CAQSM. Primary Care and [place] Instructor of [place] of [facility] of Medicine

## 2019-12-11 NOTE — Assessment & Plan Note (Signed)
Beverly [name] returns, she has multilevel cervical DDD, she failed a couple of epidurals, physical therapy. I referred her to Dr. Lynann Bologna, they do not feel as though there is any definite surgical pathology, Beverly Holm, PA-C started her on gabapentin which I agree with. She did start a 3 times daily and felt excessive sedation so I have recommended that she drop back down to 300 mg at night for a week and then up to twice daily if needed. She has noted some improvement in her symptoms. Certainly her large pendulous breasts are a problem as well, she feels her neck pain go away when she lifts her bra with her hands. Unfortunately she is not going to be able to get a breast reduction and lift through her insurance and plans to get a loan to help pay for this. I like to see her back approximately 2 months after her surgery.

## 2019-12-29 DIAGNOSIS — E039 Hypothyroidism, unspecified: Secondary | ICD-10-CM | POA: Diagnosis not present

## 2019-12-29 DIAGNOSIS — N951 Menopausal and female climacteric states: Secondary | ICD-10-CM | POA: Diagnosis not present

## 2020-01-01 DIAGNOSIS — N951 Menopausal and female climacteric states: Secondary | ICD-10-CM | POA: Diagnosis not present

## 2020-01-01 DIAGNOSIS — R6882 Decreased libido: Secondary | ICD-10-CM | POA: Diagnosis not present

## 2020-01-01 DIAGNOSIS — R232 Flushing: Secondary | ICD-10-CM | POA: Diagnosis not present

## 2020-01-01 DIAGNOSIS — E039 Hypothyroidism, unspecified: Secondary | ICD-10-CM | POA: Diagnosis not present

## 2020-01-07 ENCOUNTER — Ambulatory Visit: Payer: BC Managed Care – PPO | Admitting: Diagnostic Neuroimaging

## 2020-02-13 DIAGNOSIS — H40013 Open angle with borderline findings, low risk, bilateral: Secondary | ICD-10-CM | POA: Diagnosis not present

## 2020-03-30 DIAGNOSIS — E039 Hypothyroidism, unspecified: Secondary | ICD-10-CM | POA: Diagnosis not present

## 2020-04-01 DIAGNOSIS — N951 Menopausal and female climacteric states: Secondary | ICD-10-CM | POA: Diagnosis not present

## 2020-04-01 DIAGNOSIS — R5383 Other fatigue: Secondary | ICD-10-CM | POA: Diagnosis not present

## 2020-04-01 DIAGNOSIS — E039 Hypothyroidism, unspecified: Secondary | ICD-10-CM | POA: Diagnosis not present

## 2020-04-01 DIAGNOSIS — R6882 Decreased libido: Secondary | ICD-10-CM | POA: Diagnosis not present

## 2020-07-06 DIAGNOSIS — R635 Abnormal weight gain: Secondary | ICD-10-CM | POA: Diagnosis not present

## 2020-07-06 DIAGNOSIS — N951 Menopausal and female climacteric states: Secondary | ICD-10-CM | POA: Diagnosis not present

## 2020-07-06 LAB — COMPREHENSIVE METABOLIC PANEL
Albumin: 4.3 (ref 3.5–5.0)
Calcium: 8.9 (ref 8.7–10.7)
Globulin: 1.8

## 2020-07-06 LAB — BASIC METABOLIC PANEL
BUN: 8 (ref 4–21)
Chloride: 107 (ref 99–108)
Creatinine: 0.7 (ref 0.5–1.1)
Glucose: 117
Potassium: 4.3 (ref 3.4–5.3)
Sodium: 142 (ref 137–147)

## 2020-07-06 LAB — HEMOGLOBIN A1C: Hemoglobin A1C: 5.6

## 2020-07-06 LAB — LIPID PANEL
Cholesterol: 158 (ref 0–200)
HDL: 59 (ref 35–70)
LDL Cholesterol: 86
Triglycerides: 63 (ref 40–160)

## 2020-07-06 LAB — VITAMIN D 25 HYDROXY (VIT D DEFICIENCY, FRACTURES): Vit D, 25-Hydroxy: 43.1

## 2020-07-06 LAB — CBC AND DIFFERENTIAL
HCT: 39 (ref 36–46)
Hemoglobin: 13.9 (ref 12.0–16.0)
Platelets: 249 (ref 150–399)
WBC: 4.6

## 2020-07-06 LAB — CBC: RBC: 4.19 (ref 3.87–5.11)

## 2020-07-06 LAB — TSH: TSH: 1.38 (ref 0.41–5.90)

## 2020-07-09 LAB — TESTOSTERONE: Testosterone: 84

## 2020-07-09 LAB — TESTOSTERONE, FREE: TESTOSTERONE FREE: 5.6

## 2020-07-09 LAB — FSH/LH
FSH: 14.3
LH: 7.5

## 2020-07-09 LAB — ESTRADIOL: Estradiol: 85.9

## 2020-08-26 ENCOUNTER — Other Ambulatory Visit: Payer: Self-pay

## 2020-08-26 ENCOUNTER — Emergency Department (INDEPENDENT_AMBULATORY_CARE_PROVIDER_SITE_OTHER)
Admission: RE | Admit: 2020-08-26 | Discharge: 2020-08-26 | Disposition: A | Discharge status: Home / Self Care | Payer: BC Managed Care – PPO | Source: Ambulatory Visit

## 2020-08-26 VITALS — BP 128/85 | HR 85 | Temp 98.4°F | Resp 18

## 2020-08-26 DIAGNOSIS — J069 Acute upper respiratory infection, unspecified: Secondary | ICD-10-CM

## 2020-08-26 DIAGNOSIS — J0100 Acute maxillary sinusitis, unspecified: Secondary | ICD-10-CM

## 2020-08-26 DIAGNOSIS — R1110 Vomiting, unspecified: Secondary | ICD-10-CM

## 2020-08-26 MED ORDER — AMOXICILLIN-POT CLAVULANATE 875-125 MG PO TABS: 1.0000 | ORAL_TABLET | Freq: Two times a day (BID) | ORAL | 0 refills | Status: DC

## 2020-08-26 MED ORDER — ONDANSETRON 4 MG PO TBDP: 4.0000 mg | ORAL_TABLET | Freq: Three times a day (TID) | ORAL | 0 refills | Status: DC | PRN

## 2020-08-26 MED ORDER — IPRATROPIUM BROMIDE 0.06 % NA SOLN: 2.0000 | Freq: Four times a day (QID) | NASAL | 1 refills | Status: AC

## 2020-08-26 MED ORDER — BENZONATATE 100 MG PO CAPS: 100.0000 mg | ORAL_CAPSULE | Freq: Three times a day (TID) | ORAL | 0 refills | Status: DC

## 2020-08-26 NOTE — Discharge Instructions (Signed)
  You may take 500mg acetaminophen every 4-6 hours or in combination with ibuprofen 400-600mg every 6-8 hours as needed for pain, inflammation, and fever.  Be sure to well hydrated with clear liquids and get at least 8 hours of sleep at night, preferably more while sick.   Please follow up with family medicine in 1 week if needed.   

## 2020-08-26 NOTE — ED Provider Notes (Signed)
[name]    CSN: [id number] Arrival date & time: 08/26/20  1051      History   Chief Complaint Chief Complaint  Patient presents with  . Sinus issues  . Emesis    HPI Beverly [name] is a 44 y.o. female.   HPI Beverly [name] is a 44 y.o. female presenting to UC with c/o sinus congestion and pressure for about 1 week. Associated productive cough and post-tussive vomiting a few days ago.  Left sided frontal headache.  She has taken sudafed, mucinex, tylenol as needed and aleve cold and sinus with mild relief. Hx of sinus infections. Denies fever, chills or nausea.  No known sick contacts. Denies chest pain or SOB.  She is not vaccinated for COVID.    Past Medical History:  Diagnosis Date  . Diabetes mellitus   . PCOS (polycystic ovarian syndrome)   . Thyroid disease    hashimoto's    Patient Active Problem List   Diagnosis Date Noted  . Macromastia 11/13/2019  . Radiculitis of right cervical region 08/04/2019  . Lumbar radiculitis 08/04/2019  . Chronic pain of right knee 08/04/2019  . PCOS (polycystic ovarian syndrome) 07/23/2017  . Relies on partner vasectomy for contraception 07/23/2017  . Prediabetes 07/23/2017  . History of gestational diabetes 07/23/2017  . Obesity (BMI 30-39.9) 06/24/2014  . Hypothyroidism 07/09/2010    Past Surgical History:  Procedure Laterality Date  . BREAST BIOPSY    . WISDOM TOOTH EXTRACTION      OB History    Gravida  6   Para  4   Term  4   Preterm      AB  2   Living  4     SAB      TAB  2   Ectopic      Multiple      Live Births               Home Medications    Prior to Admission medications   Medication Sig Start Date End Date Taking? Authorizing Provider  amoxicillin-clavulanate (AUGMENTIN) 875-125 MG tablet Take 1 tablet by mouth 2 (two) times daily. One po bid x 7 days 08/26/20   Tyrell Antonio  ARMOUR THYROID 90 MG tablet Take 135 mg by mouth every morning.  06/30/18    [provider]  benzonatate (TESSALON) 100 MG capsule Take 1-2 capsules (100-200 mg total) by mouth every 8 (eight) hours. 08/26/20   Noe Gens, PA-C  cyclobenzaprine (FLEXERIL) 10 MG tablet Take 1 tablet (10 mg total) by mouth at bedtime. For muscle relaxant 06/11/19   Jacqulyn Cane, MD  Ferrous Sulfate (IRON PO) Take by mouth.    [provider]  gabapentin (NEURONTIN) 300 MG capsule Take 300 mg by mouth 3 (three) times daily as needed. 11/28/19   [provider]  ipratropium (ATROVENT) 0.06 % nasal spray Place 2 sprays into both nostrils 4 (four) times daily. 08/26/20   Noe Gens, PA-C  loratadine (CLARITIN) 10 MG tablet Take 10 mg by mouth daily.    [provider]  metFORMIN (GLUCOPHAGE) 500 MG tablet  10/23/19   [provider]  MOBIC 15 MG tablet Take 15 mg by mouth daily as needed. 11/28/19   [provider]  ondansetron (ZOFRAN-ODT) 4 MG disintegrating tablet Take 1 tablet (4 mg total) by mouth every 8 (eight) hours as needed for nausea or vomiting. 08/26/20   Noe Gens, PA-C  progesterone (  PROMETRIUM) 100 MG capsule TAKE 1 CAPSULE AT BEDTIME EVERY CYCLE ONCE A DAY 05/29/19   [provider]  pseudoephedrine (SUDAFED) 30 MG tablet Take 30 mg by mouth every 4 (four) hours as needed for congestion.    [provider]  VITAMIN D, CHOLECALCIFEROL, PO Take by mouth.    [provider]    Family History Family History  Problem Relation Age of Onset  . Thyroid disease Mother   . Cancer Paternal Grandmother        breast  . Cancer Maternal Grandmother        breast CA  . Thyroid disease Maternal Grandmother   . Diabetes Maternal Grandfather     Social History Social History   Tobacco Use  . Smoking status: Never Smoker  . Smokeless tobacco: Never Used  Substance Use Topics  . Alcohol use: Yes    Comment: social  . Drug use: No     Allergies   Eggs or egg-derived products and Metformin  and related   Review of Systems Review of Systems  Constitutional: Negative for chills and fever.  HENT: Positive for congestion, sinus pressure and sinus pain. Negative for ear pain, sore throat, trouble swallowing and voice change.   Respiratory: Positive for cough. Negative for shortness of breath.   Cardiovascular: Negative for chest pain and palpitations.  Gastrointestinal: Positive for vomiting (post-tussive). Negative for abdominal pain, diarrhea and nausea.  Musculoskeletal: Negative for arthralgias, back pain and myalgias.  Skin: Negative for rash.  Neurological: Positive for headaches. Negative for dizziness and light-headedness.  All other systems reviewed and are negative.    Physical Exam Triage Vital Signs ED Triage Vitals  Enc Vitals Group     BP 08/26/20 1107 128/85     Pulse Rate 08/26/20 1107 85     Resp 08/26/20 1107 18     Temp 08/26/20 1107 98.4 F (36.9 C)     Temp Source 08/26/20 1107 Oral     SpO2 08/26/20 1107 98 %     Weight --      Height --      Head Circumference --      Peak Flow --      Pain Score 08/26/20 1109 5     Pain Loc --      Pain Edu? --      Excl. in [place]? --    No data found.  Updated Vital Signs BP 128/85 (BP Location: Left Arm)   Pulse 85   Temp 98.4 F (36.9 C) (Oral)   Resp 18   SpO2 98%   Visual Acuity Right Eye Distance:   Left Eye Distance:   Bilateral Distance:    Right Eye Near:   Left Eye Near:    Bilateral Near:     Physical Exam Vitals and nursing note reviewed.  Constitutional:      General: She is not in acute distress.    Appearance: Normal appearance. She is well-developed. She is not ill-appearing, toxic-appearing or diaphoretic.  HENT:     Head: Normocephalic and atraumatic.     Right Ear: Tympanic membrane and ear canal normal.     Left Ear: Tympanic membrane and ear canal normal.     Nose: Mucosal edema present.     Right Sinus: Maxillary sinus tenderness present. No frontal sinus tenderness.      Left Sinus: Maxillary sinus tenderness present. No frontal sinus tenderness.     Mouth/Throat:     Lips: Pink.  Mouth: Mucous membranes are moist.     Pharynx: Oropharynx is clear. Uvula midline. No pharyngeal swelling, oropharyngeal exudate, posterior oropharyngeal erythema or uvula swelling.  Cardiovascular:     Rate and Rhythm: Normal rate and regular rhythm.  Pulmonary:     Effort: Pulmonary effort is normal. No respiratory distress.     Breath sounds: Normal breath sounds. No stridor. No wheezing, rhonchi or rales.  Musculoskeletal:        General: Normal range of motion.     Cervical back: Normal range of motion and neck supple. No tenderness.  Lymphadenopathy:     Cervical: No cervical adenopathy.  Skin:    General: Skin is warm and dry.  Neurological:     Mental Status: She is alert and oriented to person, place, and time.  Psychiatric:        Behavior: Behavior normal.      UC Treatments / Results  Labs (all labs ordered are listed, but only abnormal results are displayed) Labs Reviewed  NOVEL CORONAVIRUS, NAA    EKG   Radiology No results found.  Procedures Procedures (including critical care time)  Medications Ordered in UC Medications - No data to display  Initial Impression / Assessment and Plan / UC Course  I have reviewed the triage vital signs and the nursing notes.  Pertinent labs & imaging results that were available during my care of the patient were reviewed by me and considered in my medical decision making (see chart for details).    COVID PCR test pending Will tx for maxillary sinusitis Rx: Augmentin Encouraged symptomatic tx F/u with PCP next week as needed  Final Clinical Impressions(s) / UC Diagnoses   Final diagnoses:  Acute upper respiratory infection  Acute non-recurrent maxillary sinusitis  Post-tussive vomiting     Discharge Instructions      You may take 500mg  acetaminophen every 4-6 hours or in combination  with ibuprofen 400-600mg  every 6-8 hours as needed for pain, inflammation, and fever.  Be sure to well hydrated with clear liquids and get at least 8 hours of sleep at night, preferably more while sick.   Please follow up with family medicine in 1 week if needed.     ED Prescriptions    Medication Sig Dispense Auth. Provider   ondansetron (ZOFRAN-ODT) 4 MG disintegrating tablet Take 1 tablet (4 mg total) by mouth every 8 (eight) hours as needed for nausea or vomiting. 12 tablet Gerarda Fraction, Hena Ewalt O, PA-C   benzonatate (TESSALON) 100 MG capsule Take 1-2 capsules (100-200 mg total) by mouth every 8 (eight) hours. 21 capsule Gerarda Fraction, Joreen Swearingin O, PA-C   ipratropium (ATROVENT) 0.06 % nasal spray Place 2 sprays into both nostrils 4 (four) times daily. 15 mL Leeroy Cha O, PA-C   amoxicillin-clavulanate (AUGMENTIN) 875-125 MG tablet Take 1 tablet by mouth 2 (two) times daily. One po bid x 7 days 14 tablet Noe Gens, [place]     PDMP not reviewed this encounter.   Noe Gens, [place] 08/27/20 1134

## 2020-08-26 NOTE — ED Triage Notes (Signed)
Pt c/o sinus pressure x 1 week. Productive cough, also started vomiting a few days ago due to so much drainage. LT sided headache. Sudafed, mucinex, aleve cold and sinus as well as tylenol prn. Hx of sinus infections.

## 2020-08-28 LAB — SARS-COV-2, NAA 2 DAY TAT

## 2020-08-28 LAB — NOVEL CORONAVIRUS, NAA: SARS-CoV-2, NAA: DETECTED — AB

## 2020-08-29 ENCOUNTER — Encounter: Payer: Self-pay | Admitting: Oncology

## 2020-08-29 ENCOUNTER — Telehealth: Payer: Self-pay | Admitting: Nurse Practitioner

## 2020-08-29 ENCOUNTER — Telehealth: Payer: Self-pay | Admitting: Oncology

## 2020-08-29 NOTE — Telephone Encounter (Signed)
Re: [facility]  Called to Discuss with patient about Covid symptoms and the use of regeneron, a monoclonal antibody infusion for those with mild to moderate Covid symptoms and at a high risk of hospitalization.     Pt is qualified for this infusion at the [place] infusion center due to co-morbid conditions and/or a member of an at-risk group.    Past Medical History:  Diagnosis Date  . Diabetes mellitus   . PCOS (polycystic ovarian syndrome)   . Thyroid disease    hashimoto's    Specific risk condition-DM, Obesity    Unable to reach pt. Left VM, [facility] and sent text message.   Rulon Abide, AGNP-C [phone] ([place])

## 2020-08-29 NOTE — Telephone Encounter (Signed)
Called to discuss with patient about Covid symptoms and the use of casirivimab/imdevimab, a monoclonal antibody infusion for those with mild to moderate Covid symptoms and at a high risk of hospitalization.  Pt is qualified for this infusion at the [facility] infusion center due to; Specific high risk criteria : BMI > 25, Diabetes and Other high risk medical condition per CDC:  PCOS, Hashimoto's Thyroiditis   Message left to call back our hotline [phone].  A MyChart message was also sent to the patient's MyChart account.  Walden Field, NP [facility]

## 2020-08-31 ENCOUNTER — Encounter: Payer: BC Managed Care – PPO | Admitting: Osteopathic Medicine

## 2020-09-16 ENCOUNTER — Encounter: Payer: BC Managed Care – PPO | Admitting: Osteopathic Medicine

## 2020-09-20 ENCOUNTER — Other Ambulatory Visit: Payer: Self-pay

## 2020-09-20 ENCOUNTER — Ambulatory Visit (INDEPENDENT_AMBULATORY_CARE_PROVIDER_SITE_OTHER): Payer: BC Managed Care – PPO | Admitting: Osteopathic Medicine

## 2020-09-20 ENCOUNTER — Encounter: Payer: Self-pay | Admitting: Osteopathic Medicine

## 2020-09-20 VITALS — BP 128/80 | HR 80 | Temp 98.2°F | Wt 209.0 lb

## 2020-09-20 DIAGNOSIS — Z1211 Encounter for screening for malignant neoplasm of colon: Secondary | ICD-10-CM | POA: Diagnosis not present

## 2020-09-20 DIAGNOSIS — R41840 Attention and concentration deficit: Secondary | ICD-10-CM

## 2020-09-20 DIAGNOSIS — Z0000 Encounter for general adult medical examination without abnormal findings: Secondary | ICD-10-CM

## 2020-09-20 NOTE — Patient Instructions (Signed)
General Preventive Care  Most recent routine screening labs: reviewed.   Blood pressure goal 130/80 or less.   Tobacco: don't!   Alcohol: responsible moderation is ok for most adults - if you have concerns about your alcohol intake, please talk to me!   Exercise: as tolerated to reduce risk of cardiovascular disease and diabetes. Strength training will also prevent osteoporosis.   Mental health: if need for mental health care (medicines, counseling, other), or concerns about moods, please let me know!   Sexual / Reproductive health: if need for STD testing, or if concerns with libido/pain problems, please let me know! If you need to discuss family planning, please let me know!   Advanced Directive: Living Will and/or Healthcare Power of Attorney recommended for all adults, regardless of age or health.  Vaccines  Flu vaccine: for almost everyone, every fall.   Shingles vaccine: after age 22.   Pneumonia vaccines: after age 73, or sooner if certain medical conditions.  Tetanus booster: every 10 years - due 2030  COVID vaccine: STRONGLY RECOMMENDED  Cancer screenings   Colon cancer screening: for everyone age 31-75. Colonoscopy available for all, many people also qualify for the Cologuard stool test     Breast cancer screening: mammogram at age 63 every other year at least, and annually after age 48.   Cervical cancer screening: Pap every 1 to 5 years depending on age and other risk factors. Can usually stop at age 27 or w/ hysterectomy.   Lung cancer screening: not needed for non-smokers  Infection screenings  . HIV: recommended screening at least once age 82-65, more often as needed. . Gonorrhea/Chlamydia: screening as needed . Hepatitis C: recommended once for everyone age 2-75 . TB: certain at-risk populations, or depending on work requirements and/or travel history Other . Bone Density Test: recommended for women at age 71

## 2020-09-20 NOTE — Progress Notes (Signed)
Beverly [name] is a 44 y.o. female who presents to  [facility]  today, 09/20/20, seeking care for the following:  . Annual physical  o [facility] in [place] managing hormones, pt brings labs w/ her, normal A1C, no other significant concerns  o Sees OBGYN for well-woman care  o Son recently diagnosed w/ ADHD, she has hx bipolar/depression, stable off medications, would like evaluation for ADHD as well. Pt denies significant depression/anxiety at this time.      ASSESSMENT & PLAN with other pertinent findings:  The primary encounter diagnosis was Annual physical exam. Diagnoses of Concentration deficit and Colon cancer screening were also pertinent to this visit.   No results found for this or any previous visit (from the past 24 hour(s)).   Patient Instructions  General Preventive Care  Most recent routine screening labs: reviewed.   Blood pressure goal 130/80 or less.   Tobacco: don't!   Alcohol: responsible moderation is ok for most adults - if you have concerns about your alcohol intake, please talk to me!   Exercise: as tolerated to reduce risk of cardiovascular disease and diabetes. Strength training will also prevent osteoporosis.   Mental health: if need for mental health care (medicines, counseling, other), or concerns about moods, please let me know!   Sexual / Reproductive health: if need for STD testing, or if concerns with libido/pain problems, please let me know! If you need to discuss family planning, please let me know!   Advanced Directive: Living Will and/or Healthcare Power of Attorney recommended for all adults, regardless of age or health.  Vaccines  Flu vaccine: for almost everyone, every fall.   Shingles vaccine: after age 81.   Pneumonia vaccines: after age 79, or sooner if certain medical conditions.  Tetanus booster: every 10 years - due 2030  COVID vaccine: STRONGLY RECOMMENDED   Cancer screenings   Colon cancer screening: for everyone age 52-75. Colonoscopy available for all, many people also qualify for the Cologuard stool test     Breast cancer screening: mammogram at age 82 every other year at least, and annually after age 76.   Cervical cancer screening: Pap every 1 to 5 years depending on age and other risk factors. Can usually stop at age 66 or w/ hysterectomy.   Lung cancer screening: not needed for non-smokers  Infection screenings  . HIV: recommended screening at least once age 72-65, more often as needed. . Gonorrhea/Chlamydia: screening as needed . Hepatitis C: recommended once for everyone age 25-75 . TB: certain at-risk populations, or depending on work requirements and/or travel history Other . Bone Density Test: recommended for women at age 29    Orders Placed This Encounter  Procedures  . Ambulatory referral to [facility]  . Ambulatory referral to Gastroenterology    No orders of the defined types were placed in this encounter.      Follow-up instructions: Return in about 1 year (around 09/20/2021) for ANNUAL CHECK-UP - SEE [place] SOONER IF NEEDED.                                         BP 128/80 (BP Location: Left Arm, Patient Position: Sitting, Cuff Size: Large)   Pulse 80   Temp 98.2 F (36.8 C) (Oral)   Wt 209 lb 0.6 oz (94.8 kg)   BMI 33.74 kg/m  Constitutional:  . VSS, see nurse notes . General Appearance: alert, well-developed, well-nourished, NAD Eyes: Marland Kitchen Normal lids and conjunctive, non-icteric sclera Neck: . No masses, trachea midline . No thyroid enlargement/tenderness/mass appreciated Respiratory: . Normal respiratory effort . Breath sounds normal, no wheeze/rhonchi/rales Cardiovascular: . S1/S2 normal, no murmur/rub/gallop auscultated . No lower extremity edema Gastrointestinal: . Nontender, no masses . No hepatomegaly, no splenomegaly . No hernia  appreciated Musculoskeletal:  . Gait normal . No clubbing/cyanosis of digits Neurological: . No cranial nerve deficit on limited exam . Motor and sensation intact and symmetric Psychiatric: . Normal judgment/insight . Normal mood and affect   Current Meds  Medication Sig  . ARMOUR THYROID 90 MG tablet Take 135 mg by mouth every morning.   . cyclobenzaprine (FLEXERIL) 10 MG tablet Take 1 tablet (10 mg total) by mouth at bedtime. For muscle relaxant  . Ferrous Sulfate (IRON PO) Take by mouth.  . gabapentin (NEURONTIN) 300 MG capsule Take 300 mg by mouth 3 (three) times daily as needed.  Marland Kitchen ipratropium (ATROVENT) 0.06 % nasal spray Place 2 sprays into both nostrils 4 (four) times daily.  Marland Kitchen loratadine (CLARITIN) 10 MG tablet Take 10 mg by mouth daily.  . metFORMIN (GLUCOPHAGE) 500 MG tablet   . MOBIC 15 MG tablet Take 15 mg by mouth daily as needed.  Marland Kitchen VITAMIN D, CHOLECALCIFEROL, PO Take by mouth.    No results found for this or any previous visit (from the past 72 hour(s)).  No results found.     All questions at time of visit were answered - patient instructed to contact office with any additional concerns or updates.  ER/RTC precautions were reviewed with the patient as applicable.   Please note: voice recognition software was used to produce this document, and typos may escape review. Please contact Dr. Sheppard Coil for any needed clarifications.

## 2020-10-01 ENCOUNTER — Emergency Department (INDEPENDENT_AMBULATORY_CARE_PROVIDER_SITE_OTHER): Payer: BC Managed Care – PPO

## 2020-10-01 ENCOUNTER — Other Ambulatory Visit: Payer: Self-pay

## 2020-10-01 ENCOUNTER — Emergency Department (INDEPENDENT_AMBULATORY_CARE_PROVIDER_SITE_OTHER)
Admission: RE | Admit: 2020-10-01 | Discharge: 2020-10-01 | Disposition: A | Discharge status: Home / Self Care | Payer: BC Managed Care – PPO | Source: Ambulatory Visit

## 2020-10-01 VITALS — BP 118/82 | HR 80 | Temp 97.8°F | Resp 18

## 2020-10-01 DIAGNOSIS — M5416 Radiculopathy, lumbar region: Secondary | ICD-10-CM

## 2020-10-01 DIAGNOSIS — M549 Dorsalgia, unspecified: Secondary | ICD-10-CM

## 2020-10-01 DIAGNOSIS — M4316 Spondylolisthesis, lumbar region: Secondary | ICD-10-CM | POA: Diagnosis not present

## 2020-10-01 DIAGNOSIS — M545 Low back pain, unspecified: Secondary | ICD-10-CM | POA: Diagnosis not present

## 2020-10-01 MED ORDER — KETOROLAC TROMETHAMINE 60 MG/2ML IM SOLN
60.0000 mg | Freq: Once | INTRAMUSCULAR | Status: AC
  Administered 2020-10-01: 60 mg via INTRAMUSCULAR

## 2020-10-01 MED ORDER — CYCLOBENZAPRINE HCL 10 MG PO TABS: 10.0000 mg | ORAL_TABLET | Freq: Every day | ORAL | 0 refills | Status: AC

## 2020-10-01 MED ORDER — PREDNISONE 20 MG PO TABS: ORAL_TABLET | ORAL | 0 refills | Status: DC

## 2020-10-01 MED ORDER — TRAMADOL HCL 50 MG PO TABS
50.0000 mg | ORAL_TABLET | Freq: Four times a day (QID) | ORAL | 0 refills | Status: DC | PRN
Start: 2020-10-01 — End: 2022-06-20

## 2020-10-01 MED ORDER — DEXAMETHASONE SODIUM PHOSPHATE 10 MG/ML IJ SOLN
10.0000 mg | Freq: Once | INTRAMUSCULAR | Status: AC
  Administered 2020-10-01: 10 mg via INTRAMUSCULAR

## 2020-10-01 NOTE — ED Triage Notes (Signed)
Pt presents to Urgent Care with c/o lower back pain. Pt reports she has a hx of back issues, but had an acute episode of pain 5 days ago while bending over. Pt reports pain radiates to both legs, and she has a "tingling sensation in tail bone." Pt states she has occasional pressure in rectal area and had a few episodes of urinary incontinence yesterday; also reports tingling and numbness to 4th and 5th toes of both feet. She has been taking ibuprofen w/o significant relief of pain.

## 2020-10-01 NOTE — ED Provider Notes (Signed)
[name]    CSN: [id number] Arrival date & time: 10/01/20  0944      History   Chief Complaint Chief Complaint  Patient presents with  . Back Pain    HPI Beverly [name] is a 44 y.o. female.   HPI Patient presents today for evaluation of lower back pain.  Patient has a history of lumbar radiculitis and has had a complete extensive work-up approximately 1 year ago.  Since that time she has not had any issues with back pain.  She received an epidural injection from Dr. Darene Lamer at [facility] which remedied the problem.  Patient reports while installing some weather stripping approximately 5 days ago she experienced some mild discomfort in her lower back symptoms have steadily progressed over the last 5 days.  She endorses intermittent numbness and tingling in her bilateral feet and toes.  She is also had an episode in which she has leaked urine when bending down to pick up a heavy [place] on yesterday.  She is ambulatory with normal gait however experiences pain with certain sudden movements and with sitting in certain positions. Past Medical History:  Diagnosis Date  . Diabetes mellitus   . PCOS (polycystic ovarian syndrome)   . Thyroid disease    hashimoto's    Patient Active Problem List   Diagnosis Date Noted  . Macromastia 11/13/2019  . Radiculitis of right cervical region 08/04/2019  . Lumbar radiculitis 08/04/2019  . Chronic pain of right knee 08/04/2019  . PCOS (polycystic ovarian syndrome) 07/23/2017  . Relies on partner vasectomy for contraception 07/23/2017  . Prediabetes 07/23/2017  . History of gestational diabetes 07/23/2017  . Obesity (BMI 30-39.9) 06/24/2014  . Hypothyroidism 07/09/2010    Past Surgical History:  Procedure Laterality Date  . BREAST BIOPSY    . WISDOM TOOTH EXTRACTION      OB History    Gravida  6   Para  4   Term  4   Preterm      AB  2   Living  4     SAB      TAB  2   Ectopic      Multiple       Live Births               Home Medications    Prior to Admission medications   Medication Sig Start Date End Date Taking? Authorizing Provider  ARMOUR THYROID 90 MG tablet Take 135 mg by mouth every morning.  06/30/18   [provider]  cyclobenzaprine (FLEXERIL) 10 MG tablet Take 1 tablet (10 mg total) by mouth at bedtime. For muscle relaxant 06/11/19   Jacqulyn Cane, MD  Ferrous Sulfate (IRON PO) Take by mouth.    [provider]  gabapentin (NEURONTIN) 300 MG capsule Take 300 mg by mouth 3 (three) times daily as needed. 11/28/19   [provider]  ipratropium (ATROVENT) 0.06 % nasal spray Place 2 sprays into both nostrils 4 (four) times daily. 08/26/20   Noe Gens, PA-C  loratadine (CLARITIN) 10 MG tablet Take 10 mg by mouth daily.    [provider]  metFORMIN (GLUCOPHAGE) 500 MG tablet  10/23/19   [provider]  MOBIC 15 MG tablet Take 15 mg by mouth daily as needed. 11/28/19   [provider]  VITAMIN D, CHOLECALCIFEROL, PO Take by mouth.    [provider]    Family History Family History  Problem Relation Age of  Onset  . Thyroid disease Mother   . Breast cancer Other   . Cancer Paternal Grandmother        breast  . Cancer Maternal Grandmother        breast CA  . Thyroid disease Maternal Grandmother   . Diabetes Maternal Grandfather     Social History Social History   Tobacco Use  . Smoking status: Never Smoker  . Smokeless tobacco: Never Used  Substance Use Topics  . Alcohol use: Yes    Comment: social  . Drug use: No     Allergies   Eggs or egg-derived products and Metformin and related  Review of Systems Review of Systems Pertinent negatives listed in HPI Physical Exam Triage Vital Signs ED Triage Vitals  Enc Vitals Group     BP 10/01/20 1021 (!) 149/99     Pulse Rate 10/01/20 1021 80     Resp 10/01/20 1021 18     Temp 10/01/20 1021 97.8 F (36.6 C)     Temp Source 10/01/20  1021 Oral     SpO2 10/01/20 1021 97 %     Weight --      Height --      Head Circumference --      Peak Flow --      Pain Score 10/01/20 1023 3     Pain Loc --      Pain Edu? --      Excl. in [place]? --    No data found.  Updated Vital Signs BP 118/82 (BP Location: Right Arm)   Pulse 80   Temp 97.8 F (36.6 C) (Oral)   Resp 18   LMP 09/15/2020   SpO2 97%   Visual Acuity Right Eye Distance:   Left Eye Distance:   Bilateral Distance:    Right Eye Near:   Left Eye Near:    Bilateral Near:     Physical Exam Cardiovascular:     Rate and Rhythm: Normal rate and regular rhythm.  Pulmonary:     Effort: Pulmonary effort is normal.     Breath sounds: Normal breath sounds.  Musculoskeletal:       Arms:     Lumbar back: Tenderness present. No swelling or edema. Decreased range of motion. Positive right straight leg raise test and positive left straight leg raise test.  Skin:    General: Skin is warm and dry.     Capillary Refill: Capillary refill takes less than 2 seconds.      UC Treatments / Results  Labs (all labs ordered are listed, but only abnormal results are displayed) Labs Reviewed - No data to display  EKG   Radiology DG Lumbar Spine Complete  Result Date: 10/01/2020 CLINICAL DATA:  Chronic back pain. EXAM: LUMBAR SPINE - COMPLETE 4+ VIEW COMPARISON:  MRI 09/01/2019.  Lumbar spine series 08/04/2019. FINDINGS: Lumbar spine numbered the lowest segmented appearing lumbar shaped vertebrae on lateral view as L5. Stable 3 mm anterolisthesis L4 on L5. Diffuse stable degenerative change. No acute bony abnormality identified. No evidence of fracture. Pelvic calcifications consistent phleboliths. IMPRESSION: Stable 3 mm anterolisthesis L4 on L5. Diffuse stable degenerative change. No acute abnormality identified. Electronically Signed   By: Marcello Moores  Register   On: 10/01/2020 11:09    Procedures Procedures (including critical care time)  Medications Ordered in  UC Medications  ketorolac (TORADOL) injection 60 mg (60 mg Intramuscular Given 10/01/20 1129)  dexamethasone (DECADRON) injection 10 mg (10 mg Intramuscular Given 10/01/20 1129)  Initial Impression / Assessment and Plan / UC Course  I have reviewed the triage vital signs and the nursing notes.  Pertinent labs & imaging results that were available during my care of the patient were reviewed by me and considered in my medical decision making (see chart for details).    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 Toradol and Decadron IM given here in clinic today. Strict ER precautions given if focal symptoms develop. See discharge medication for home management. Follow-up with Dr. Darene Lamer on Monday. Final Clinical Impressions(s) / UC Diagnoses   Final diagnoses:  Lumbar radiculitis     Discharge Instructions     Please contact Dr. Darene Lamer on Monday. Although your imaging today continues to show stable lumbar spine, I am concerned that she have experienced symptoms concerning for neurological involvement and would highly recommend if any of the symptoms occur following the treatment you received here in clinic today to go immediately to the emergency department for further work-up and evaluation. You have been given Toradol and Decadron which is similar to prednisone injection here today.  Start your prednisone taper for tomorrow.  I prescribed you cyclobenzaprine which is a muscle relaxer for pain along with tramadol for pain.    ED Prescriptions    Medication Sig Dispense Auth. Provider   cyclobenzaprine (FLEXERIL) 10 MG tablet Take 1 tablet (10 mg total) by mouth at bedtime. For muscle relaxant 10 tablet Scot Jun, FNP   predniSONE (DELTASONE) 20 MG tablet Take 3 PO QAM x3days, 2 PO QAM x3days, 1 PO QAM x3days 18 tablet Scot Jun, FNP   traMADol (ULTRAM) 50 MG tablet Take 1 tablet (50 mg total) by mouth every 6 (six) hours as needed. 15 tablet Scot Jun, FNP     PDMP not reviewed this encounter.   Scot Jun, FNP 10/05/20 2203

## 2020-10-01 NOTE — Discharge Instructions (Addendum)
Please contact Dr. Darene Lamer on Monday. Your imaging today shows stable lumbar spine degenerative disease L4-L5, however I am concerned that other underlying nerve related issue could be present given the symptoms your described are neurological involvement and I would highly recommend Emergency Department evaluation if any of these symptoms continue in spite of  treatment you received here in clinic today. If any symptoms persists or worsen go immediately to the emergency department for further work-up and evaluation. You have been given Toradol and Decadron which is similar to prednisone injection here today.  Start your prednisone taper for tomorrow.  I prescribed you cyclobenzaprine which is a muscle relaxer for pain along with tramadol for pain.

## 2020-10-11 DIAGNOSIS — N951 Menopausal and female climacteric states: Secondary | ICD-10-CM | POA: Diagnosis not present

## 2020-10-11 DIAGNOSIS — R635 Abnormal weight gain: Secondary | ICD-10-CM | POA: Diagnosis not present

## 2020-11-26 ENCOUNTER — Encounter: Payer: Self-pay | Admitting: Osteopathic Medicine

## 2021-01-25 ENCOUNTER — Encounter: Payer: Self-pay | Admitting: Osteopathic Medicine

## 2021-03-30 ENCOUNTER — Other Ambulatory Visit: Payer: Self-pay

## 2021-03-30 ENCOUNTER — Encounter: Payer: Self-pay | Admitting: Emergency Medicine

## 2021-03-30 ENCOUNTER — Emergency Department (INDEPENDENT_AMBULATORY_CARE_PROVIDER_SITE_OTHER)
Admission: EM | Admit: 2021-03-30 | Discharge: 2021-03-30 | Disposition: A | Discharge status: Home / Self Care | Payer: BC Managed Care – PPO | Source: Home / Self Care

## 2021-03-30 ENCOUNTER — Telehealth: Payer: Self-pay | Admitting: Emergency Medicine

## 2021-03-30 DIAGNOSIS — M5441 Lumbago with sciatica, right side: Secondary | ICD-10-CM | POA: Diagnosis not present

## 2021-03-30 DIAGNOSIS — Z3202 Encounter for pregnancy test, result negative: Secondary | ICD-10-CM | POA: Diagnosis not present

## 2021-03-30 LAB — POCT URINALYSIS DIP (MANUAL ENTRY)
Bilirubin, UA: NEGATIVE
Glucose, UA: NEGATIVE mg/dL
Ketones, POC UA: NEGATIVE mg/dL
Leukocytes, UA: NEGATIVE
Nitrite, UA: NEGATIVE
Protein Ur, POC: NEGATIVE mg/dL
Spec Grav, UA: 1.025 (ref 1.010–1.025)
Urobilinogen, UA: 0.2 E.U./dL
pH, UA: 6 (ref 5.0–8.0)

## 2021-03-30 LAB — POCT URINE PREGNANCY: Preg Test, Ur: NEGATIVE

## 2021-03-30 MED ORDER — PREDNISONE 10 MG (21) PO TBPK: ORAL_TABLET | Freq: Every day | ORAL | 0 refills | Status: DC

## 2021-03-30 MED ORDER — METHOCARBAMOL 500 MG PO TABS: 500.0000 mg | ORAL_TABLET | Freq: Three times a day (TID) | ORAL | 0 refills | Status: AC | PRN

## 2021-03-30 NOTE — ED Triage Notes (Signed)
Lower back pain since Sunday - chronic on & off issue Sharp pain Last episode was Thanksgiving  Has had an PT in past w/ epidural - made it worse Urgency has gotten worse since thanksgiving  Sees a chiropractor & exercises

## 2021-03-30 NOTE — Discharge Instructions (Signed)
Take tapered prednisone and Robaxin as directed.

## 2021-03-30 NOTE — ED Provider Notes (Signed)
[facility]  ____________________________________________  Time seen: Approximately 9:41 AM  I have reviewed the triage vital signs and the nursing notes.   HISTORY  Chief Complaint Back Pain   Historian Patient     HPI Beverly [name] is a 45 y.o. female presents to the urgent care with right-sided low back pain that radiates down the right lower extremity.  Patient states that in particular, she has tingling of the right fifth toe.  Patient states that she has had an extensive work-up including MRIs of her cervical, thoracic and lumbar spine which were all reassuring.  Patient states that she was advised to have an MRI brain but states that she has not conducted MRI due to her deductible at this time.  She denies bowel or bladder incontinence or saddle anesthesia.  No fever at home.  She states that she has had some increased urinary frequency.   Past Medical History:  Diagnosis Date  . Diabetes mellitus    prediabetic   . PCOS (polycystic ovarian syndrome)   . Thyroid disease    hashimoto's     Immunizations up to date:  Yes.     Past Medical History:  Diagnosis Date  . Diabetes mellitus    prediabetic   . PCOS (polycystic ovarian syndrome)   . Thyroid disease    hashimoto's    Patient Active Problem List   Diagnosis Date Noted  . Macromastia 11/13/2019  . Radiculitis of right cervical region 08/04/2019  . Lumbar radiculitis 08/04/2019  . Chronic pain of right knee 08/04/2019  . PCOS (polycystic ovarian syndrome) 07/23/2017  . Relies on partner vasectomy for contraception 07/23/2017  . Prediabetes 07/23/2017  . History of gestational diabetes 07/23/2017  . Obesity (BMI 30-39.9) 06/24/2014  . Hypothyroidism 07/09/2010    Past Surgical History:  Procedure Laterality Date  . BREAST BIOPSY    . WISDOM TOOTH EXTRACTION      Prior to Admission medications   Medication Sig Start Date End Date Taking? Authorizing Provider  Ferrous Sulfate  (IRON PO) Take by mouth.   Yes [provider]  methocarbamol (ROBAXIN) 500 MG tablet Take 1 tablet (500 mg total) by mouth every 8 (eight) hours as needed for up to 5 days for muscle spasms. 03/30/21 04/04/21 Yes Vallarie Mare M, PA-C  predniSONE (STERAPRED UNI-PAK 21 TAB) 10 MG (21) TBPK tablet Take by mouth daily. 6,5,4,3,2,1 03/30/21  Yes Vallarie Mare M, PA-C  VITAMIN D, CHOLECALCIFEROL, PO Take by mouth.   Yes [provider]  ARMOUR THYROID 90 MG tablet Take 135 mg by mouth every morning.  06/30/18   [provider]  cyclobenzaprine (FLEXERIL) 10 MG tablet Take 1 tablet (10 mg total) by mouth at bedtime. For muscle relaxant Patient not taking: Reported on 03/30/2021 10/01/20   Scot Jun, FNP  gabapentin (NEURONTIN) 300 MG capsule Take 300 mg by mouth 3 (three) times daily as needed. 11/28/19   [provider]  ipratropium (ATROVENT) 0.06 % nasal spray Place 2 sprays into both nostrils 4 (four) times daily. Patient not taking: Reported on 03/30/2021 08/26/20   Noe Gens, PA-C  loratadine (CLARITIN) 10 MG tablet Take 10 mg by mouth daily. Patient not taking: Reported on 03/30/2021    [provider]  metFORMIN (GLUCOPHAGE) 500 MG tablet  10/23/19   [provider]  MOBIC 15 MG tablet Take 15 mg by mouth daily as needed. Patient not taking: Reported on 03/30/2021 11/28/19   [provider]  traMADol (ULTRAM) 50 MG tablet Take 1 tablet (50 mg total) by mouth every 6 (six) hours as needed. Patient not taking: Reported on 03/30/2021 10/01/20   Scot Jun, FNP    Allergies Eggs or egg-derived products and Metformin and related  Family History  Problem Relation Age of Onset  . Thyroid disease Mother   . Breast cancer Other   . Cancer Paternal Grandmother        breast  . Cancer Maternal Grandmother        breast CA  . Thyroid disease Maternal Grandmother   . Diabetes Maternal Grandfather     Social  History Social History   Tobacco Use  . Smoking status: Never Smoker  . Smokeless tobacco: Never Used  Vaping Use  . Vaping Use: Never used  Substance Use Topics  . Alcohol use: Yes    Comment: social  . Drug use: No     Review of Systems  Constitutional: No fever/chills Eyes:  No discharge ENT: No upper respiratory complaints. Respiratory: no cough. No SOB/ use of accessory muscles to breath Gastrointestinal:   No nausea, no vomiting.  No diarrhea.  No constipation. Musculoskeletal: Patient has low back pain.  Skin: Negative for rash, abrasions, lacerations, ecchymosis.    ____________________________________________   PHYSICAL EXAM:  VITAL SIGNS: ED Triage Vitals  Enc Vitals Group     BP 03/30/21 0844 124/83     Pulse Rate 03/30/21 0844 67     Resp 03/30/21 0844 17     Temp 03/30/21 0844 98.6 F (37 C)     Temp Source 03/30/21 0844 Oral     SpO2 03/30/21 0844 97 %     Weight --      Height --      Head Circumference --      Peak Flow --      Pain Score 03/30/21 0848 4     Pain Loc --      Pain Edu? --      Excl. in [place]? --      Constitutional: Alert and oriented. Well appearing and in no acute distress. Eyes: Conjunctivae are normal. PERRL. EOMI. Head: Atraumatic. ENT: Cardiovascular: Normal rate, regular rhythm. Normal S1 and S2.  Good peripheral circulation. Respiratory: Normal respiratory effort without tachypnea or retractions. Lungs CTAB. Good air entry to the bases with no decreased or absent breath sounds Gastrointestinal: Bowel sounds x 4 quadrants. Soft and nontender to palpation. No guarding or rigidity. No distention. Musculoskeletal: Full range of motion to all extremities. No obvious deformities noted.   Neurologic:  Normal for age. No gross focal neurologic deficits are appreciated.  Cranial nerves II through XII are intact.  Negative Romberg.  Patient can perform rapid alternating movements. Skin:  Skin is warm, dry and intact. No rash  noted. Psychiatric: Mood and affect are normal for age. Speech and behavior are normal.   ____________________________________________   LABS (all labs ordered are listed, but only abnormal results are displayed)  Labs Reviewed  POCT URINALYSIS DIP (MANUAL ENTRY) - Abnormal; Notable for the following components:      Result Value   Blood, UA moderate (*)    All other components within normal limits  POCT URINE PREGNANCY   ____________________________________________  EKG   ____________________________________________  RADIOLOGY  No results found.  ____________________________________________    PROCEDURES  Procedure(s) performed:     Procedures     Medications - No data to display   ____________________________________________   INITIAL  IMPRESSION / ASSESSMENT AND PLAN / ED COURSE  Pertinent labs & imaging results that were available during my care of the patient were reviewed by me and considered in my medical decision making (see chart for details).      Assessment and plan Low back pain 45 year old female presents to the urgent care with radiating low back pain.  Vital signs are reassuring at triage.  Patient had no neurodeficits noted on exam.  We will treat patient with tapered prednisone and Robaxin at this time.  She was advised to follow-up with her PCP to schedule nonemergent brain MRI.  All patient questions were answered.     ____________________________________________  FINAL CLINICAL IMPRESSION(S) / ED DIAGNOSES  Final diagnoses:  Acute bilateral low back pain with right-sided sciatica      NEW MEDICATIONS STARTED DURING THIS VISIT:  ED Discharge Orders         Ordered    predniSONE (STERAPRED UNI-PAK 21 TAB) 10 MG (21) TBPK tablet  Daily        03/30/21 0925    methocarbamol (ROBAXIN) 500 MG tablet  Every 8 hours PRN        03/30/21 0925              This chart was dictated using voice recognition software/Dragon.  Despite best efforts to proofread, errors can occur which can change the meaning. Any change was purely unintentional.     Lannie Fields, [place] 03/30/21 [phone]

## 2021-03-30 NOTE — Telephone Encounter (Signed)
Call from [place] did not receive prescription from provided- set to print mode when RN checked - will be resent - provider notified

## 2021-05-23 DIAGNOSIS — M5441 Lumbago with sciatica, right side: Secondary | ICD-10-CM | POA: Diagnosis not present

## 2021-05-23 DIAGNOSIS — G8929 Other chronic pain: Secondary | ICD-10-CM | POA: Diagnosis not present

## 2021-05-23 DIAGNOSIS — R079 Chest pain, unspecified: Secondary | ICD-10-CM | POA: Diagnosis not present

## 2021-06-08 DIAGNOSIS — M4317 Spondylolisthesis, lumbosacral region: Secondary | ICD-10-CM | POA: Diagnosis not present

## 2021-06-08 DIAGNOSIS — M47816 Spondylosis without myelopathy or radiculopathy, lumbar region: Secondary | ICD-10-CM | POA: Diagnosis not present

## 2021-06-08 DIAGNOSIS — M5441 Lumbago with sciatica, right side: Secondary | ICD-10-CM | POA: Diagnosis not present

## 2021-06-08 DIAGNOSIS — M4316 Spondylolisthesis, lumbar region: Secondary | ICD-10-CM | POA: Diagnosis not present

## 2021-06-08 DIAGNOSIS — M48062 Spinal stenosis, lumbar region with neurogenic claudication: Secondary | ICD-10-CM | POA: Diagnosis not present

## 2021-06-16 DIAGNOSIS — M5126 Other intervertebral disc displacement, lumbar region: Secondary | ICD-10-CM | POA: Diagnosis not present

## 2021-06-16 DIAGNOSIS — M545 Low back pain, unspecified: Secondary | ICD-10-CM | POA: Diagnosis not present

## 2021-06-16 DIAGNOSIS — M48061 Spinal stenosis, lumbar region without neurogenic claudication: Secondary | ICD-10-CM | POA: Diagnosis not present

## 2021-06-16 DIAGNOSIS — M2578 Osteophyte, vertebrae: Secondary | ICD-10-CM | POA: Diagnosis not present

## 2021-06-16 DIAGNOSIS — M4802 Spinal stenosis, cervical region: Secondary | ICD-10-CM | POA: Diagnosis not present

## 2021-06-16 DIAGNOSIS — M5022 Other cervical disc displacement, mid-cervical region, unspecified level: Secondary | ICD-10-CM | POA: Diagnosis not present

## 2021-06-29 DIAGNOSIS — R202 Paresthesia of skin: Secondary | ICD-10-CM | POA: Diagnosis not present

## 2021-06-29 DIAGNOSIS — G8929 Other chronic pain: Secondary | ICD-10-CM | POA: Diagnosis not present

## 2021-06-29 DIAGNOSIS — M5441 Lumbago with sciatica, right side: Secondary | ICD-10-CM | POA: Diagnosis not present

## 2021-07-04 DIAGNOSIS — F5101 Primary insomnia: Secondary | ICD-10-CM | POA: Diagnosis not present

## 2021-07-04 DIAGNOSIS — K219 Gastro-esophageal reflux disease without esophagitis: Secondary | ICD-10-CM | POA: Diagnosis not present

## 2021-07-06 DIAGNOSIS — M47816 Spondylosis without myelopathy or radiculopathy, lumbar region: Secondary | ICD-10-CM | POA: Diagnosis not present

## 2021-07-20 DIAGNOSIS — M47816 Spondylosis without myelopathy or radiculopathy, lumbar region: Secondary | ICD-10-CM | POA: Diagnosis not present

## 2021-07-20 DIAGNOSIS — M5416 Radiculopathy, lumbar region: Secondary | ICD-10-CM | POA: Diagnosis not present

## 2021-07-20 DIAGNOSIS — M48062 Spinal stenosis, lumbar region with neurogenic claudication: Secondary | ICD-10-CM | POA: Diagnosis not present

## 2021-07-20 DIAGNOSIS — M5412 Radiculopathy, cervical region: Secondary | ICD-10-CM | POA: Diagnosis not present

## 2021-08-01 DIAGNOSIS — R202 Paresthesia of skin: Secondary | ICD-10-CM | POA: Diagnosis not present

## 2021-08-01 DIAGNOSIS — M5416 Radiculopathy, lumbar region: Secondary | ICD-10-CM | POA: Diagnosis not present

## 2021-08-01 DIAGNOSIS — M5412 Radiculopathy, cervical region: Secondary | ICD-10-CM | POA: Diagnosis not present

## 2021-08-01 DIAGNOSIS — G43909 Migraine, unspecified, not intractable, without status migrainosus: Secondary | ICD-10-CM | POA: Diagnosis not present

## 2021-08-10 DIAGNOSIS — M47816 Spondylosis without myelopathy or radiculopathy, lumbar region: Secondary | ICD-10-CM | POA: Diagnosis not present

## 2021-08-14 DIAGNOSIS — G43909 Migraine, unspecified, not intractable, without status migrainosus: Secondary | ICD-10-CM | POA: Diagnosis not present

## 2021-08-14 DIAGNOSIS — J3489 Other specified disorders of nose and nasal sinuses: Secondary | ICD-10-CM | POA: Diagnosis not present

## 2021-08-14 DIAGNOSIS — R519 Headache, unspecified: Secondary | ICD-10-CM | POA: Diagnosis not present

## 2021-08-25 DIAGNOSIS — G43909 Migraine, unspecified, not intractable, without status migrainosus: Secondary | ICD-10-CM | POA: Diagnosis not present

## 2021-08-25 DIAGNOSIS — M5412 Radiculopathy, cervical region: Secondary | ICD-10-CM | POA: Diagnosis not present

## 2021-08-25 DIAGNOSIS — M5416 Radiculopathy, lumbar region: Secondary | ICD-10-CM | POA: Diagnosis not present

## 2021-10-05 DIAGNOSIS — M48062 Spinal stenosis, lumbar region with neurogenic claudication: Secondary | ICD-10-CM | POA: Diagnosis not present

## 2021-10-05 DIAGNOSIS — M5416 Radiculopathy, lumbar region: Secondary | ICD-10-CM | POA: Diagnosis not present

## 2021-10-05 DIAGNOSIS — M47816 Spondylosis without myelopathy or radiculopathy, lumbar region: Secondary | ICD-10-CM | POA: Diagnosis not present

## 2021-10-05 DIAGNOSIS — M5412 Radiculopathy, cervical region: Secondary | ICD-10-CM | POA: Diagnosis not present

## 2021-10-11 DIAGNOSIS — M5416 Radiculopathy, lumbar region: Secondary | ICD-10-CM | POA: Diagnosis not present

## 2022-06-20 ENCOUNTER — Ambulatory Visit
Admission: RE | Admit: 2022-06-20 | Discharge: 2022-06-20 | Disposition: A | Discharge status: Home / Self Care | Payer: BC Managed Care – PPO | Source: Ambulatory Visit | Attending: Family Medicine | Admitting: Family Medicine

## 2022-06-20 VITALS — BP 145/77 | HR 77 | Temp 98.8°F | Resp 16

## 2022-06-20 DIAGNOSIS — L03116 Cellulitis of left lower limb: Secondary | ICD-10-CM

## 2022-06-20 DIAGNOSIS — L255 Unspecified contact dermatitis due to plants, except food: Secondary | ICD-10-CM | POA: Diagnosis not present

## 2022-06-20 MED ORDER — PREDNISONE 20 MG PO TABS: ORAL_TABLET | ORAL | 0 refills | Status: AC

## 2022-06-20 MED ORDER — DOXYCYCLINE HYCLATE 100 MG PO CAPS: ORAL_CAPSULE | ORAL | 0 refills | Status: AC

## 2022-06-20 MED ORDER — METHYLPREDNISOLONE SODIUM SUCC 125 MG IJ SOLR
80.0000 mg | Freq: Once | INTRAMUSCULAR | Status: AC
  Administered 2022-06-20: 80 mg via INTRAMUSCULAR

## 2022-06-20 NOTE — ED Provider Notes (Signed)
[name]    CSN: [id number] Arrival date & time: 06/20/22  1010      History   Chief Complaint Chief Complaint  Patient presents with  . Skin Ulcer    Got into poison oak or ivy. Not sure. I have one spot that looks like it's infected and hurts. It's been over a week now it's getting worse after buying all the over the counter product I could find. - Entered by patient  . Rash    HPI Beverly [name] is a 46 y.o. female.    Rash  Past Medical History:  Diagnosis Date  . Diabetes mellitus    prediabetic   . PCOS (polycystic ovarian syndrome)   . Thyroid disease    hashimoto's    Patient Active Problem List   Diagnosis Date Noted  . Macromastia 11/13/2019  . Radiculitis of right cervical region 08/04/2019  . Lumbar radiculitis 08/04/2019  . Chronic pain of right knee 08/04/2019  . PCOS (polycystic ovarian syndrome) 07/23/2017  . Relies on partner vasectomy for contraception 07/23/2017  . Prediabetes 07/23/2017  . History of gestational diabetes 07/23/2017  . Obesity (BMI 30-39.9) 06/24/2014  . Hypothyroidism 07/09/2010    Past Surgical History:  Procedure Laterality Date  . BACK SURGERY    . BREAST BIOPSY    . WISDOM TOOTH EXTRACTION      OB History     Gravida  6   Para  4   Term  4   Preterm      AB  2   Living  4      SAB      IAB  2   Ectopic      Multiple      Live Births               Home Medications    Prior to Admission medications   Medication Sig Start Date End Date Taking? Authorizing Provider  doxycycline (VIBRAMYCIN) 100 MG capsule Take one cap PO Q12hr with food. 06/20/22  Yes Kandra Nicolas, MD  levothyroxine (SYNTHROID) 25 MCG tablet Take 1 tablet (25 mcg total) every morning 60 minutes prior to taking of any other pills, food/drink. 03/24/22  Yes [provider]  predniSONE (DELTASONE) 20 MG tablet Take one tab by mouth twice daily for 4 days, then one daily for 3 days. Take with food.  06/20/22  Yes Kandra Nicolas, MD  ARMOUR THYROID 90 MG tablet Take 135 mg by mouth every morning.  06/30/18   [provider]  busPIRone (BUSPAR) 10 MG tablet Take 10 mg by mouth daily. 06/13/22   [provider]  Cholecalciferol 125 MCG (5000 UT) TABS Take by mouth.    [provider]  cyclobenzaprine (FLEXERIL) 10 MG tablet Take 1 tablet (10 mg total) by mouth at bedtime. For muscle relaxant Patient not taking: Reported on 03/30/2021 10/01/20   Scot Jun, FNP  Ferrous Sulfate (IRON PO) Take by mouth.    [provider]  FLUoxetine (PROZAC) 40 MG capsule Take 40 mg by mouth daily. 06/13/22   [provider]  gabapentin (NEURONTIN) 300 MG capsule Take 300 mg by mouth 3 (three) times daily as needed. 11/28/19   [provider]  ipratropium (ATROVENT) 0.06 % nasal spray Place 2 sprays into both nostrils 4 (four) times daily. Patient not taking: Reported on 03/30/2021 08/26/20   Noe Gens, PA-C  levocetirizine Harlow Ohms ALLERGY 24HR) 5 MG tablet 1 tablet in  the evening    [provider]  loratadine (CLARITIN) 10 MG tablet Take 10 mg by mouth daily. Patient not taking: Reported on 03/30/2021    [provider]  metFORMIN (GLUCOPHAGE) 500 MG tablet  10/23/19   [provider]  MOBIC 15 MG tablet Take 15 mg by mouth daily as needed. Patient not taking: Reported on 03/30/2021 11/28/19   [provider]  ondansetron (ZOFRAN) 8 MG tablet Take by mouth. 05/29/22   [provider]  spironolactone (ALDACTONE) 50 MG tablet Take 50 mg by mouth daily. 04/06/22   [provider]  traZODone (DESYREL) 50 MG tablet Take 50 mg by mouth at bedtime. 06/13/22   [provider]  VITAMIN D, CHOLECALCIFEROL, PO Take by mouth.    [provider]  WEGOVY 1.7 MG/0.75ML SOAJ Inject into the skin. 06/09/22   [provider]    Family History Family History  Problem Relation Age of Onset  .  Thyroid disease Mother   . Breast cancer Other   . Cancer Paternal Grandmother        breast  . Cancer Maternal Grandmother        breast CA  . Thyroid disease Maternal Grandmother   . Diabetes Maternal Grandfather     Social History Social History   Tobacco Use  . Smoking status: Never  . Smokeless tobacco: Never  Vaping Use  . Vaping Use: Never used  Substance Use Topics  . Alcohol use: Yes    Comment: social  . Drug use: No     Allergies   Eggs or egg-derived products and Metformin and related   Review of Systems Review of Systems  Skin:  Positive for rash.    Physical Exam Triage Vital Signs ED Triage Vitals  Enc Vitals Group     BP 06/20/22 1049 (!) 145/77     Pulse Rate 06/20/22 1049 77     Resp 06/20/22 1049 16     Temp 06/20/22 1049 98.8 F (37.1 C)     Temp Source 06/20/22 1049 Oral     SpO2 06/20/22 1049 99 %     Weight --      Height --      Head Circumference --      Peak Flow --      Pain Score 06/20/22 1052 0     Pain Loc --      Pain Edu? --      Excl. in [place]? --    No data found.  Updated Vital Signs BP (!) 145/77 (BP Location: Right Arm)   Pulse 77   Temp 98.8 F (37.1 C) (Oral)   Resp 16   SpO2 99%   Visual Acuity Right Eye Distance:   Left Eye Distance:   Bilateral Distance:    Right Eye Near:   Left Eye Near:    Bilateral Near:     Physical Exam Musculoskeletal:       Legs:    UC Treatments / Results  Labs (all labs ordered are listed, but only abnormal results are displayed) Labs Reviewed - No data to display  EKG   Radiology No results found.  Procedures Procedures (including critical care time)  Medications Ordered in UC Medications  methylPREDNISolone sodium succinate (SOLU-MEDROL) 125 mg/2 mL injection 80 mg (has no administration in time range)    Initial Impression / Assessment and Plan / UC Course  I have reviewed the triage vital signs and the nursing notes.  Pertinent labs & imaging  results that were available during my care of the patient were reviewed by me and considered in my medical decision making (see chart for details).    Lesion on left lower leg becoming infected. Begin doxycycline for one week. Administered Solumedrol '80mg'$  IM; then begin prednisone burst/taper. Followup with Family Doctor if not improved in one week.   Final Clinical Impressions(s) / UC Diagnoses   Final diagnoses:  Rhus dermatitis  Cellulitis of left anterior lower leg     Discharge Instructions      Begin prednisone Wednesday 06/21/22. May take Benadryl '25mg'$ , one or two at bedtime for itching. May apply 1% hydrocortisone to areas of worst itching.    ED Prescriptions     Medication Sig Dispense Auth. Provider   doxycycline (VIBRAMYCIN) 100 MG capsule Take one cap PO Q12hr with food. 14 capsule Kandra Nicolas, MD   predniSONE (DELTASONE) 20 MG tablet Take one tab by mouth twice daily for 4 days, then one daily for 3 days. Take with food. 11 tablet Kandra Nicolas, MD      PDMP not reviewed this encounter.

## 2022-06-20 NOTE — ED Triage Notes (Signed)
Pt states last week she got into poison ivy while working outside and she has used OTC creams but states rash is getting worse and spreading all over arms and legs.

## 2022-06-20 NOTE — Discharge Instructions (Signed)
Begin prednisone Wednesday 06/21/22. May take Benadryl '25mg'$ , one or two at bedtime for itching. May apply 1% hydrocortisone to areas of worst itching.

## 2022-06-21 ENCOUNTER — Telehealth: Payer: Self-pay | Admitting: Emergency Medicine

## 2022-06-21 NOTE — Telephone Encounter (Signed)
LEft message, Calling to see if she is improving, hope she is getting better.

## 2023-02-20 ENCOUNTER — Other Ambulatory Visit (HOSPITAL_BASED_OUTPATIENT_CLINIC_OR_DEPARTMENT_OTHER): Payer: Self-pay

## 2023-02-20 MED ORDER — FLUOXETINE HCL 20 MG PO CAPS
20.0000 mg | ORAL_CAPSULE | Freq: Every day | ORAL | 0 refills | Status: DC
  Filled 2023-02-20: qty 30, 30d supply, fill #0

## 2023-02-20 MED ORDER — BUSPIRONE HCL 10 MG PO TABS
10.0000 mg | ORAL_TABLET | Freq: Every day | ORAL | 0 refills | Status: DC
  Filled 2023-02-20: qty 30, 30d supply, fill #0

## 2023-02-20 MED ORDER — DEXMETHYLPHENIDATE HCL ER 20 MG PO CP24
20.0000 mg | ORAL_CAPSULE | Freq: Every morning | ORAL | 0 refills | Status: DC
  Filled 2023-02-20: qty 30, 30d supply, fill #0

## 2023-02-20 MED ORDER — FLUOXETINE HCL 40 MG PO CAPS
40.0000 mg | ORAL_CAPSULE | Freq: Every day | ORAL | 0 refills | Status: DC
  Filled 2023-02-20: qty 30, 30d supply, fill #0

## 2023-03-21 ENCOUNTER — Other Ambulatory Visit (HOSPITAL_BASED_OUTPATIENT_CLINIC_OR_DEPARTMENT_OTHER): Payer: Self-pay

## 2023-03-21 ENCOUNTER — Other Ambulatory Visit: Payer: Self-pay

## 2023-03-21 MED ORDER — FLUOXETINE HCL 40 MG PO CAPS
40.0000 mg | ORAL_CAPSULE | Freq: Every day | ORAL | 0 refills | Status: DC
  Filled 2023-03-21: qty 30, 30d supply, fill #0

## 2023-03-21 MED ORDER — BUSPIRONE HCL 10 MG PO TABS
10.0000 mg | ORAL_TABLET | Freq: Every day | ORAL | 0 refills | Status: DC
  Filled 2023-03-21: qty 30, 30d supply, fill #0

## 2023-03-21 MED ORDER — DEXMETHYLPHENIDATE HCL ER 20 MG PO CP24
20.0000 mg | ORAL_CAPSULE | Freq: Every morning | ORAL | 0 refills | Status: AC
  Filled 2023-03-21: qty 30, 30d supply, fill #0

## 2023-03-21 MED ORDER — FLUOXETINE HCL 20 MG PO CAPS
20.0000 mg | ORAL_CAPSULE | Freq: Every day | ORAL | 0 refills | Status: DC
  Filled 2023-03-21: qty 30, 30d supply, fill #0

## 2023-04-23 ENCOUNTER — Other Ambulatory Visit (HOSPITAL_BASED_OUTPATIENT_CLINIC_OR_DEPARTMENT_OTHER): Payer: Self-pay

## 2023-04-23 MED ORDER — FLUOXETINE HCL 20 MG PO CAPS
20.0000 mg | ORAL_CAPSULE | Freq: Every day | ORAL | 0 refills | Status: DC
  Filled 2023-04-23: qty 30, 30d supply, fill #0

## 2023-04-23 MED ORDER — DEXMETHYLPHENIDATE HCL ER 10 MG PO CP24
10.0000 mg | ORAL_CAPSULE | Freq: Every morning | ORAL | 0 refills | Status: DC
  Filled 2023-04-23: qty 30, 30d supply, fill #0

## 2023-04-23 MED ORDER — FLUOXETINE HCL 40 MG PO CAPS
40.0000 mg | ORAL_CAPSULE | Freq: Every day | ORAL | 0 refills | Status: DC
  Filled 2023-04-23: qty 30, 30d supply, fill #0

## 2023-04-23 MED ORDER — BUSPIRONE HCL 10 MG PO TABS
10.0000 mg | ORAL_TABLET | Freq: Every day | ORAL | 0 refills | Status: DC
  Filled 2023-04-23: qty 30, 30d supply, fill #0

## 2023-05-03 ENCOUNTER — Other Ambulatory Visit: Payer: Self-pay

## 2023-05-23 ENCOUNTER — Other Ambulatory Visit (HOSPITAL_BASED_OUTPATIENT_CLINIC_OR_DEPARTMENT_OTHER): Payer: Self-pay

## 2023-05-23 MED ORDER — DEXMETHYLPHENIDATE HCL ER 10 MG PO CP24
10.0000 mg | ORAL_CAPSULE | Freq: Every morning | ORAL | 0 refills | Status: DC
  Filled 2023-05-23: qty 30, 30d supply, fill #0

## 2023-05-23 MED ORDER — FLUOXETINE HCL 20 MG PO CAPS
20.0000 mg | ORAL_CAPSULE | Freq: Every day | ORAL | 0 refills | Status: DC
  Filled 2023-05-23: qty 30, 30d supply, fill #0

## 2023-05-23 MED ORDER — BUSPIRONE HCL 10 MG PO TABS
10.0000 mg | ORAL_TABLET | Freq: Every day | ORAL | 0 refills | Status: DC
  Filled 2023-05-23: qty 30, 30d supply, fill #0

## 2023-05-23 MED ORDER — FLUOXETINE HCL 40 MG PO CAPS
40.0000 mg | ORAL_CAPSULE | Freq: Every day | ORAL | 0 refills | Status: DC
  Filled 2023-05-23: qty 30, 30d supply, fill #0

## 2023-05-24 ENCOUNTER — Other Ambulatory Visit (HOSPITAL_BASED_OUTPATIENT_CLINIC_OR_DEPARTMENT_OTHER): Payer: Self-pay

## 2023-07-06 ENCOUNTER — Other Ambulatory Visit (HOSPITAL_BASED_OUTPATIENT_CLINIC_OR_DEPARTMENT_OTHER): Payer: Self-pay

## 2023-07-06 MED ORDER — FLUOXETINE HCL 20 MG PO CAPS
20.0000 mg | ORAL_CAPSULE | Freq: Every day | ORAL | 1 refills | Status: AC
  Filled 2023-07-06: qty 30, 30d supply, fill #0
  Filled 2024-03-28: qty 30, 30d supply, fill #1

## 2023-07-06 MED ORDER — DEXMETHYLPHENIDATE HCL ER 10 MG PO CP24
10.0000 mg | ORAL_CAPSULE | Freq: Every morning | ORAL | 0 refills | Status: AC
  Filled 2023-08-30: qty 30, 30d supply, fill #0

## 2023-07-06 MED ORDER — DEXMETHYLPHENIDATE HCL ER 10 MG PO CP24
10.0000 mg | ORAL_CAPSULE | Freq: Every morning | ORAL | 0 refills | Status: AC
  Filled 2023-07-06: qty 30, 30d supply, fill #0

## 2023-07-06 MED ORDER — FLUOXETINE HCL 40 MG PO CAPS
40.0000 mg | ORAL_CAPSULE | Freq: Every day | ORAL | 1 refills | Status: AC
  Filled 2023-07-06: qty 30, 30d supply, fill #0
  Filled 2024-03-28: qty 30, 30d supply, fill #1

## 2023-07-06 MED ORDER — BUSPIRONE HCL 10 MG PO TABS
10.0000 mg | ORAL_TABLET | Freq: Every day | ORAL | 1 refills | Status: AC
  Filled 2023-07-06: qty 30, 30d supply, fill #0

## 2023-08-30 ENCOUNTER — Other Ambulatory Visit (HOSPITAL_BASED_OUTPATIENT_CLINIC_OR_DEPARTMENT_OTHER): Payer: Self-pay

## 2023-08-30 MED ORDER — DEXMETHYLPHENIDATE HCL ER 10 MG PO CP24: 10.0000 mg | ORAL_CAPSULE | Freq: Every morning | ORAL | 0 refills | Status: AC

## 2023-08-30 MED ORDER — FLUOXETINE HCL 20 MG PO CAPS
20.0000 mg | ORAL_CAPSULE | Freq: Every day | ORAL | 1 refills | Status: AC
  Filled 2023-08-30: qty 30, 30d supply, fill #0
  Filled 2024-05-20: qty 30, 30d supply, fill #1

## 2023-08-30 MED ORDER — FLUOXETINE HCL 40 MG PO CAPS
40.0000 mg | ORAL_CAPSULE | Freq: Every day | ORAL | 1 refills | Status: AC
  Filled 2023-08-30: qty 30, 30d supply, fill #0
  Filled 2024-05-20: qty 30, 30d supply, fill #1

## 2023-08-30 MED ORDER — BUSPIRONE HCL 10 MG PO TABS
10.0000 mg | ORAL_TABLET | Freq: Every day | ORAL | 1 refills | Status: AC
  Filled 2023-08-30: qty 30, 30d supply, fill #0

## 2023-10-25 ENCOUNTER — Other Ambulatory Visit (HOSPITAL_BASED_OUTPATIENT_CLINIC_OR_DEPARTMENT_OTHER): Payer: Self-pay

## 2023-10-25 MED ORDER — DEXMETHYLPHENIDATE HCL ER 10 MG PO CP24
10.0000 mg | ORAL_CAPSULE | Freq: Every morning | ORAL | 0 refills | Status: AC
  Filled 2024-03-28: qty 30, 30d supply, fill #0

## 2023-10-25 MED ORDER — BUSPIRONE HCL 10 MG PO TABS
10.0000 mg | ORAL_TABLET | Freq: Every day | ORAL | 1 refills | Status: AC
  Filled 2023-10-25 – 2023-11-13 (×2): qty 30, 30d supply, fill #0

## 2023-10-25 MED ORDER — DEXMETHYLPHENIDATE HCL ER 10 MG PO CP24
10.0000 mg | ORAL_CAPSULE | Freq: Every day | ORAL | 0 refills | Status: AC
  Filled 2023-10-25 – 2023-11-13 (×2): qty 30, 30d supply, fill #0

## 2023-10-25 MED ORDER — FLUOXETINE HCL 20 MG PO CAPS
20.0000 mg | ORAL_CAPSULE | Freq: Every day | ORAL | 1 refills | Status: AC
  Filled 2023-10-25 – 2023-11-13 (×2): qty 30, 30d supply, fill #0
  Filled 2024-06-17: qty 30, 30d supply, fill #1

## 2023-10-25 MED ORDER — FLUOXETINE HCL 40 MG PO CAPS
40.0000 mg | ORAL_CAPSULE | Freq: Every day | ORAL | 1 refills | Status: AC
  Filled 2023-10-25 – 2023-11-13 (×2): qty 30, 30d supply, fill #0
  Filled 2024-06-17: qty 30, 30d supply, fill #1

## 2023-11-06 ENCOUNTER — Other Ambulatory Visit (HOSPITAL_BASED_OUTPATIENT_CLINIC_OR_DEPARTMENT_OTHER): Payer: Self-pay

## 2023-11-13 ENCOUNTER — Other Ambulatory Visit (HOSPITAL_BASED_OUTPATIENT_CLINIC_OR_DEPARTMENT_OTHER): Payer: Self-pay

## 2023-12-21 ENCOUNTER — Other Ambulatory Visit: Payer: Self-pay

## 2023-12-21 ENCOUNTER — Other Ambulatory Visit (HOSPITAL_BASED_OUTPATIENT_CLINIC_OR_DEPARTMENT_OTHER): Payer: Self-pay

## 2023-12-21 MED ORDER — BUSPIRONE HCL 10 MG PO TABS
10.0000 mg | ORAL_TABLET | Freq: Every day | ORAL | 1 refills | Status: AC
  Filled 2023-12-21: qty 30, 30d supply, fill #0

## 2023-12-21 MED ORDER — DEXMETHYLPHENIDATE HCL ER 10 MG PO CP24
10.0000 mg | ORAL_CAPSULE | Freq: Every morning | ORAL | 0 refills | Status: AC
  Filled 2024-06-17 – 2024-06-19 (×2): qty 30, 30d supply, fill #0

## 2023-12-21 MED ORDER — FLUOXETINE HCL 40 MG PO CAPS
40.0000 mg | ORAL_CAPSULE | Freq: Every day | ORAL | 1 refills | Status: DC
  Filled 2023-12-21: qty 30, 30d supply, fill #0
  Filled 2024-07-31: qty 30, 30d supply, fill #1

## 2023-12-21 MED ORDER — DEXMETHYLPHENIDATE HCL ER 10 MG PO CP24
10.0000 mg | ORAL_CAPSULE | Freq: Every morning | ORAL | 0 refills | Status: DC
  Filled 2024-05-20: qty 30, 30d supply, fill #0

## 2023-12-21 MED ORDER — FLUOXETINE HCL 20 MG PO CAPS
20.0000 mg | ORAL_CAPSULE | Freq: Every day | ORAL | 1 refills | Status: DC
  Filled 2023-12-21: qty 30, 30d supply, fill #0
  Filled 2024-07-31: qty 30, 30d supply, fill #1

## 2024-03-28 ENCOUNTER — Other Ambulatory Visit: Payer: Self-pay

## 2024-03-28 ENCOUNTER — Other Ambulatory Visit (HOSPITAL_BASED_OUTPATIENT_CLINIC_OR_DEPARTMENT_OTHER): Payer: Self-pay

## 2024-04-03 ENCOUNTER — Other Ambulatory Visit (HOSPITAL_BASED_OUTPATIENT_CLINIC_OR_DEPARTMENT_OTHER): Payer: Self-pay

## 2024-05-20 ENCOUNTER — Other Ambulatory Visit (HOSPITAL_BASED_OUTPATIENT_CLINIC_OR_DEPARTMENT_OTHER): Payer: Self-pay

## 2024-06-17 ENCOUNTER — Other Ambulatory Visit (HOSPITAL_BASED_OUTPATIENT_CLINIC_OR_DEPARTMENT_OTHER): Payer: Self-pay

## 2024-06-19 ENCOUNTER — Other Ambulatory Visit (HOSPITAL_BASED_OUTPATIENT_CLINIC_OR_DEPARTMENT_OTHER): Payer: Self-pay

## 2024-07-31 ENCOUNTER — Other Ambulatory Visit (HOSPITAL_BASED_OUTPATIENT_CLINIC_OR_DEPARTMENT_OTHER): Payer: Self-pay

## 2024-07-31 ENCOUNTER — Other Ambulatory Visit: Payer: Self-pay

## 2024-08-06 ENCOUNTER — Other Ambulatory Visit (HOSPITAL_BASED_OUTPATIENT_CLINIC_OR_DEPARTMENT_OTHER): Payer: Self-pay

## 2024-08-08 ENCOUNTER — Other Ambulatory Visit (HOSPITAL_BASED_OUTPATIENT_CLINIC_OR_DEPARTMENT_OTHER): Payer: Self-pay

## 2024-08-08 MED ORDER — DEXMETHYLPHENIDATE HCL ER 10 MG PO CP24
10.0000 mg | ORAL_CAPSULE | Freq: Every morning | ORAL | 0 refills | Status: AC
  Filled 2024-08-08: qty 30, 30d supply, fill #0

## 2024-08-08 MED ORDER — DEXMETHYLPHENIDATE HCL ER 10 MG PO CP24
10.0000 mg | ORAL_CAPSULE | Freq: Every morning | ORAL | 0 refills | Status: AC
  Filled 2024-10-22 – 2024-11-21 (×3): qty 30, 30d supply, fill #0

## 2024-08-08 MED ORDER — FLUOXETINE HCL 20 MG PO CAPS
20.0000 mg | ORAL_CAPSULE | Freq: Every day | ORAL | 1 refills | Status: AC
  Filled 2024-08-31: qty 30, 30d supply, fill #0
  Filled 2024-11-18: qty 30, 30d supply, fill #1

## 2024-08-08 MED ORDER — FLUOXETINE HCL 40 MG PO CAPS
40.0000 mg | ORAL_CAPSULE | Freq: Every day | ORAL | 1 refills | Status: AC
  Filled 2024-08-31: qty 30, 30d supply, fill #0
  Filled 2024-11-18: qty 30, 30d supply, fill #1

## 2024-08-08 MED ORDER — BUSPIRONE HCL 10 MG PO TABS
10.0000 mg | ORAL_TABLET | Freq: Every day | ORAL | 1 refills | Status: AC
  Filled 2024-08-08: qty 30, 30d supply, fill #0

## 2024-08-31 ENCOUNTER — Other Ambulatory Visit (HOSPITAL_BASED_OUTPATIENT_CLINIC_OR_DEPARTMENT_OTHER): Payer: Self-pay

## 2024-09-01 ENCOUNTER — Other Ambulatory Visit: Payer: Self-pay

## 2024-09-01 ENCOUNTER — Other Ambulatory Visit (HOSPITAL_BASED_OUTPATIENT_CLINIC_OR_DEPARTMENT_OTHER): Payer: Self-pay

## 2024-09-01 ENCOUNTER — Encounter (HOSPITAL_BASED_OUTPATIENT_CLINIC_OR_DEPARTMENT_OTHER): Payer: Self-pay

## 2024-10-03 ENCOUNTER — Other Ambulatory Visit (HOSPITAL_BASED_OUTPATIENT_CLINIC_OR_DEPARTMENT_OTHER): Payer: Self-pay

## 2024-10-22 ENCOUNTER — Other Ambulatory Visit: Payer: Self-pay

## 2024-10-22 ENCOUNTER — Other Ambulatory Visit (HOSPITAL_BASED_OUTPATIENT_CLINIC_OR_DEPARTMENT_OTHER): Payer: Self-pay

## 2024-10-22 MED ORDER — FLUOXETINE HCL 40 MG PO CAPS
40.0000 mg | ORAL_CAPSULE | Freq: Every day | ORAL | 1 refills | Status: AC
  Filled 2024-10-22: qty 30, 30d supply, fill #0

## 2024-10-22 MED ORDER — FLUOXETINE HCL 20 MG PO CAPS
20.0000 mg | ORAL_CAPSULE | Freq: Every day | ORAL | 1 refills | Status: AC
  Filled 2024-10-22: qty 30, 30d supply, fill #0

## 2024-10-22 MED ORDER — DEXMETHYLPHENIDATE HCL ER 10 MG PO CP24: 10.0000 mg | ORAL_CAPSULE | Freq: Every morning | ORAL | 0 refills | Status: AC

## 2024-10-22 MED ORDER — DEXMETHYLPHENIDATE HCL ER 10 MG PO CP24
10.0000 mg | ORAL_CAPSULE | Freq: Every morning | ORAL | 0 refills | Status: AC
  Filled 2024-10-22: qty 30, 30d supply, fill #0

## 2024-11-18 ENCOUNTER — Other Ambulatory Visit: Payer: Self-pay

## 2024-11-18 ENCOUNTER — Other Ambulatory Visit (HOSPITAL_BASED_OUTPATIENT_CLINIC_OR_DEPARTMENT_OTHER): Payer: Self-pay

## 2024-11-21 ENCOUNTER — Encounter (HOSPITAL_BASED_OUTPATIENT_CLINIC_OR_DEPARTMENT_OTHER): Payer: Self-pay

## 2024-11-21 ENCOUNTER — Other Ambulatory Visit (HOSPITAL_BASED_OUTPATIENT_CLINIC_OR_DEPARTMENT_OTHER): Payer: Self-pay

## 2024-11-24 ENCOUNTER — Other Ambulatory Visit (HOSPITAL_BASED_OUTPATIENT_CLINIC_OR_DEPARTMENT_OTHER): Payer: Self-pay
